# Patient Record
Sex: Female | Born: 1960 | Race: White | Hispanic: No | Smoking: Former smoker
Health system: Southern US, Community
[De-identification: ages and names within clinical notes are randomized; demographics above are authoritative.]

## PROBLEM LIST (undated history)

## (undated) DIAGNOSIS — I2699 Other pulmonary embolism without acute cor pulmonale: Secondary | ICD-10-CM

## (undated) DIAGNOSIS — E7800 Pure hypercholesterolemia, unspecified: Secondary | ICD-10-CM

## (undated) DIAGNOSIS — M797 Fibromyalgia: Secondary | ICD-10-CM

## (undated) DIAGNOSIS — R87629 Unspecified abnormal cytological findings in specimens from vagina: Secondary | ICD-10-CM

## (undated) DIAGNOSIS — I10 Essential (primary) hypertension: Secondary | ICD-10-CM

## (undated) HISTORY — DX: Pure hypercholesterolemia, unspecified: E78.00

## (undated) HISTORY — PX: HIP SURGERY: SHX245

## (undated) HISTORY — DX: Unspecified abnormal cytological findings in specimens from vagina: R87.629

## (undated) HISTORY — PX: ESOPHAGEAL DILATION: SHX303

## (undated) HISTORY — PX: TONSILLECTOMY: SUR1361

## (undated) HISTORY — DX: Other pulmonary embolism without acute cor pulmonale: I26.99

## (undated) HISTORY — DX: Essential (primary) hypertension: I10

---

## 2005-05-04 ENCOUNTER — Encounter: Payer: Self-pay | Admitting: Family Medicine

## 2005-05-04 LAB — CONVERTED CEMR LAB

## 2005-05-05 ENCOUNTER — Other Ambulatory Visit: Admission: RE | Admit: 2005-05-05 | Discharge: 2005-05-05 | Payer: Self-pay | Admitting: Family Medicine

## 2005-05-05 ENCOUNTER — Encounter (INDEPENDENT_AMBULATORY_CARE_PROVIDER_SITE_OTHER): Payer: Self-pay | Admitting: *Deleted

## 2005-05-05 ENCOUNTER — Ambulatory Visit: Payer: Self-pay | Admitting: Family Medicine

## 2005-05-15 ENCOUNTER — Ambulatory Visit: Payer: Self-pay | Admitting: Family Medicine

## 2006-02-09 DIAGNOSIS — I10 Essential (primary) hypertension: Secondary | ICD-10-CM

## 2006-02-09 DIAGNOSIS — F411 Generalized anxiety disorder: Secondary | ICD-10-CM | POA: Insufficient documentation

## 2006-06-02 ENCOUNTER — Encounter: Payer: Self-pay | Admitting: Family Medicine

## 2006-06-02 DIAGNOSIS — E669 Obesity, unspecified: Secondary | ICD-10-CM

## 2006-06-16 ENCOUNTER — Telehealth: Payer: Self-pay | Admitting: Family Medicine

## 2006-07-23 ENCOUNTER — Other Ambulatory Visit: Admission: RE | Admit: 2006-07-23 | Discharge: 2006-07-23 | Payer: Self-pay | Admitting: Family Medicine

## 2006-07-23 ENCOUNTER — Ambulatory Visit: Payer: Self-pay | Admitting: Family Medicine

## 2006-08-02 ENCOUNTER — Telehealth (INDEPENDENT_AMBULATORY_CARE_PROVIDER_SITE_OTHER): Payer: Self-pay | Admitting: *Deleted

## 2007-03-11 ENCOUNTER — Telehealth: Payer: Self-pay | Admitting: Family Medicine

## 2007-07-05 ENCOUNTER — Encounter: Payer: Self-pay | Admitting: Family Medicine

## 2007-08-05 ENCOUNTER — Encounter: Payer: Self-pay | Admitting: Family Medicine

## 2007-08-05 ENCOUNTER — Other Ambulatory Visit: Admission: RE | Admit: 2007-08-05 | Discharge: 2007-08-05 | Payer: Self-pay | Admitting: Family Medicine

## 2007-08-05 ENCOUNTER — Ambulatory Visit: Payer: Self-pay | Admitting: Family Medicine

## 2007-08-10 ENCOUNTER — Encounter: Payer: Self-pay | Admitting: Family Medicine

## 2008-03-02 ENCOUNTER — Telehealth: Payer: Self-pay | Admitting: Family Medicine

## 2008-03-02 ENCOUNTER — Ambulatory Visit: Payer: Self-pay | Admitting: Family Medicine

## 2008-03-02 ENCOUNTER — Ambulatory Visit: Admission: RE | Admit: 2008-03-02 | Discharge: 2008-03-02 | Payer: Self-pay | Admitting: Family Medicine

## 2008-03-02 ENCOUNTER — Encounter: Payer: Self-pay | Admitting: Family Medicine

## 2008-03-02 ENCOUNTER — Ambulatory Visit: Payer: Self-pay | Admitting: Surgery

## 2008-03-02 DIAGNOSIS — M79609 Pain in unspecified limb: Secondary | ICD-10-CM

## 2008-03-05 LAB — CONVERTED CEMR LAB
Albumin: 3.8 g/dL (ref 3.5–5.2)
Alkaline Phosphatase: 74 units/L (ref 39–117)
BUN: 20 mg/dL (ref 6–23)
CO2: 22 meq/L (ref 19–32)
Calcium: 8.7 mg/dL (ref 8.4–10.5)
Chloride: 104 meq/L (ref 96–112)
Eosinophils Absolute: 0.1 10*3/uL (ref 0.0–0.7)
Folate: 9.8 ng/mL
Glucose, Bld: 136 mg/dL — ABNORMAL HIGH (ref 70–99)
Lymphocytes Relative: 38 % (ref 12–46)
Lymphs Abs: 3.8 10*3/uL (ref 0.7–4.0)
MCV: 91.6 fL (ref 78.0–100.0)
Monocytes Relative: 6 % (ref 3–12)
Neutrophils Relative %: 56 % (ref 43–77)
Potassium: 3.5 meq/L (ref 3.5–5.3)
RBC: 4.76 M/uL (ref 3.87–5.11)
TSH: 3.269 microintl units/mL (ref 0.350–4.50)
Total Protein: 7 g/dL (ref 6.0–8.3)
Vitamin B-12: 270 pg/mL (ref 211–911)
WBC: 10 10*3/uL (ref 4.0–10.5)

## 2008-03-08 ENCOUNTER — Encounter: Payer: Self-pay | Admitting: Family Medicine

## 2008-08-07 ENCOUNTER — Telehealth: Payer: Self-pay | Admitting: Family Medicine

## 2017-09-21 DIAGNOSIS — H25811 Combined forms of age-related cataract, right eye: Secondary | ICD-10-CM | POA: Insufficient documentation

## 2019-04-12 ENCOUNTER — Other Ambulatory Visit: Payer: Self-pay

## 2019-04-12 ENCOUNTER — Emergency Department (HOSPITAL_BASED_OUTPATIENT_CLINIC_OR_DEPARTMENT_OTHER): Payer: Managed Care, Other (non HMO)

## 2019-04-12 ENCOUNTER — Encounter (HOSPITAL_BASED_OUTPATIENT_CLINIC_OR_DEPARTMENT_OTHER): Payer: Self-pay

## 2019-04-12 ENCOUNTER — Inpatient Hospital Stay (HOSPITAL_BASED_OUTPATIENT_CLINIC_OR_DEPARTMENT_OTHER)
Admission: EM | Admit: 2019-04-12 | Discharge: 2019-04-20 | DRG: 234 | Disposition: A | Discharge status: Home / Self Care | Payer: Managed Care, Other (non HMO) | Attending: Cardiothoracic Surgery | Admitting: Cardiothoracic Surgery

## 2019-04-12 DIAGNOSIS — R778 Other specified abnormalities of plasma proteins: Secondary | ICD-10-CM

## 2019-04-12 DIAGNOSIS — R079 Chest pain, unspecified: Secondary | ICD-10-CM | POA: Diagnosis present

## 2019-04-12 DIAGNOSIS — I973 Postprocedural hypertension: Secondary | ICD-10-CM | POA: Diagnosis not present

## 2019-04-12 DIAGNOSIS — I2510 Atherosclerotic heart disease of native coronary artery without angina pectoris: Secondary | ICD-10-CM

## 2019-04-12 DIAGNOSIS — E119 Type 2 diabetes mellitus without complications: Secondary | ICD-10-CM | POA: Diagnosis present

## 2019-04-12 DIAGNOSIS — E876 Hypokalemia: Secondary | ICD-10-CM | POA: Diagnosis not present

## 2019-04-12 DIAGNOSIS — D62 Acute posthemorrhagic anemia: Secondary | ICD-10-CM | POA: Diagnosis not present

## 2019-04-12 DIAGNOSIS — Z951 Presence of aortocoronary bypass graft: Secondary | ICD-10-CM

## 2019-04-12 DIAGNOSIS — Z9689 Presence of other specified functional implants: Secondary | ICD-10-CM

## 2019-04-12 DIAGNOSIS — I25110 Atherosclerotic heart disease of native coronary artery with unstable angina pectoris: Secondary | ICD-10-CM | POA: Diagnosis present

## 2019-04-12 DIAGNOSIS — I4891 Unspecified atrial fibrillation: Secondary | ICD-10-CM | POA: Diagnosis present

## 2019-04-12 DIAGNOSIS — M797 Fibromyalgia: Secondary | ICD-10-CM | POA: Diagnosis present

## 2019-04-12 DIAGNOSIS — Z20828 Contact with and (suspected) exposure to other viral communicable diseases: Secondary | ICD-10-CM | POA: Diagnosis not present

## 2019-04-12 DIAGNOSIS — F17200 Nicotine dependence, unspecified, uncomplicated: Secondary | ICD-10-CM | POA: Diagnosis not present

## 2019-04-12 DIAGNOSIS — Z09 Encounter for follow-up examination after completed treatment for conditions other than malignant neoplasm: Secondary | ICD-10-CM

## 2019-04-12 DIAGNOSIS — I200 Unstable angina: Secondary | ICD-10-CM | POA: Diagnosis present

## 2019-04-12 DIAGNOSIS — D696 Thrombocytopenia, unspecified: Secondary | ICD-10-CM | POA: Diagnosis not present

## 2019-04-12 DIAGNOSIS — I10 Essential (primary) hypertension: Secondary | ICD-10-CM | POA: Diagnosis not present

## 2019-04-12 DIAGNOSIS — I161 Hypertensive emergency: Secondary | ICD-10-CM

## 2019-04-12 DIAGNOSIS — I214 Non-ST elevation (NSTEMI) myocardial infarction: Secondary | ICD-10-CM | POA: Diagnosis not present

## 2019-04-12 DIAGNOSIS — Z6841 Body Mass Index (BMI) 40.0 and over, adult: Secondary | ICD-10-CM

## 2019-04-12 DIAGNOSIS — R7989 Other specified abnormal findings of blood chemistry: Secondary | ICD-10-CM

## 2019-04-12 DIAGNOSIS — Z23 Encounter for immunization: Secondary | ICD-10-CM | POA: Diagnosis not present

## 2019-04-12 HISTORY — DX: Fibromyalgia: M79.7

## 2019-04-12 LAB — CBC WITH DIFFERENTIAL/PLATELET
Abs Immature Granulocytes: 0.02 10*3/uL (ref 0.00–0.07)
Basophils Absolute: 0.1 10*3/uL (ref 0.0–0.1)
Basophils Relative: 1 %
Eosinophils Absolute: 0 10*3/uL (ref 0.0–0.5)
Eosinophils Relative: 1 %
HCT: 45 % (ref 36.0–46.0)
Hemoglobin: 14.8 g/dL (ref 12.0–15.0)
Immature Granulocytes: 0 %
Lymphocytes Relative: 26 %
Lymphs Abs: 2 10*3/uL (ref 0.7–4.0)
MCH: 29.2 pg (ref 26.0–34.0)
MCHC: 32.9 g/dL (ref 30.0–36.0)
MCV: 88.8 fL (ref 80.0–100.0)
Monocytes Absolute: 0.5 10*3/uL (ref 0.1–1.0)
Monocytes Relative: 6 %
Neutro Abs: 5.2 10*3/uL (ref 1.7–7.7)
Neutrophils Relative %: 66 %
Platelets: 245 10*3/uL (ref 150–400)
RBC: 5.07 MIL/uL (ref 3.87–5.11)
RDW: 14.4 % (ref 11.5–15.5)
WBC: 7.8 10*3/uL (ref 4.0–10.5)
nRBC: 0 % (ref 0.0–0.2)

## 2019-04-12 LAB — PROTIME-INR
INR: 1 (ref 0.8–1.2)
Prothrombin Time: 13 seconds (ref 11.4–15.2)

## 2019-04-12 LAB — COMPREHENSIVE METABOLIC PANEL
ALT: 29 U/L (ref 0–44)
AST: 26 U/L (ref 15–41)
Albumin: 3.8 g/dL (ref 3.5–5.0)
Alkaline Phosphatase: 93 U/L (ref 38–126)
Anion gap: 10 (ref 5–15)
BUN: 15 mg/dL (ref 6–20)
CO2: 23 mmol/L (ref 22–32)
Calcium: 9.9 mg/dL (ref 8.9–10.3)
Chloride: 101 mmol/L (ref 98–111)
Creatinine, Ser: 1.11 mg/dL — ABNORMAL HIGH (ref 0.44–1.00)
GFR calc Af Amer: >60 mL/min (ref >60)
GFR calc non Af Amer: 55 mL/min — ABNORMAL LOW (ref >60)
Glucose, Bld: 204 mg/dL — ABNORMAL HIGH (ref 70–99)
Potassium: 3.4 mmol/L — ABNORMAL LOW (ref 3.5–5.1)
Sodium: 134 mmol/L — ABNORMAL LOW (ref 135–145)
Total Bilirubin: 0.5 mg/dL (ref 0.3–1.2)
Total Protein: 7.7 g/dL (ref 6.5–8.1)

## 2019-04-12 LAB — HEPARIN LEVEL (UNFRACTIONATED): Heparin Unfractionated: 0.48 IU/mL (ref 0.30–0.70)

## 2019-04-12 LAB — LIPID PANEL
Cholesterol: 240 mg/dL — ABNORMAL HIGH (ref 0–200)
HDL: 37 mg/dL — ABNORMAL LOW (ref >40)
Total CHOL/HDL Ratio: 6.5 RATIO
Triglycerides: 237 mg/dL — ABNORMAL HIGH (ref <150)
VLDL: 47 mg/dL — ABNORMAL HIGH (ref 0–40)

## 2019-04-12 LAB — TROPONIN I (HIGH SENSITIVITY)
Troponin I (High Sensitivity): 260 ng/L (ref <18)
Troponin I (High Sensitivity): 449 ng/L (ref <18)

## 2019-04-12 LAB — APTT: aPTT: 26 seconds (ref 24–36)

## 2019-04-12 LAB — SARS CORONAVIRUS 2 (TAT 6-24 HRS): SARS Coronavirus 2: NEGATIVE

## 2019-04-12 MED ORDER — SODIUM CHLORIDE 0.9 % IV SOLN: INTRAVENOUS | Status: DC

## 2019-04-12 MED ORDER — HEPARIN BOLUS VIA INFUSION
4000.0000 [IU] | Freq: Once | INTRAVENOUS | Status: AC
  Administered 2019-04-12: 4000 [IU] via INTRAVENOUS

## 2019-04-12 MED ORDER — HEPARIN (PORCINE) 25000 UT/250ML-% IV SOLN
INTRAVENOUS | Status: AC
  Administered 2019-04-12: 1000 [IU]/h via INTRAVENOUS
  Filled 2019-04-12: qty 250

## 2019-04-12 MED ORDER — MORPHINE SULFATE (PF) 4 MG/ML IV SOLN
4.0000 mg | INTRAVENOUS | Status: DC | PRN
  Administered 2019-04-12 – 2019-04-14 (×6): 4 mg via INTRAVENOUS
  Filled 2019-04-12 (×6): qty 1

## 2019-04-12 MED ORDER — ONDANSETRON HCL 4 MG/2ML IJ SOLN
4.0000 mg | Freq: Once | INTRAMUSCULAR | Status: AC | PRN
  Administered 2019-04-12: 4 mg via INTRAVENOUS
  Filled 2019-04-12: qty 2

## 2019-04-12 MED ORDER — NITROGLYCERIN 0.4 MG SL SUBL
SUBLINGUAL_TABLET | SUBLINGUAL | Status: AC
  Administered 2019-04-12: 0.4 mg
  Filled 2019-04-12: qty 1

## 2019-04-12 MED ORDER — HEPARIN (PORCINE) 25000 UT/250ML-% IV SOLN
1250.0000 [IU]/h | INTRAVENOUS | Status: DC
  Administered 2019-04-13: 1250 [IU]/h via INTRAVENOUS
  Filled 2019-04-12: qty 250

## 2019-04-12 MED ORDER — NITROGLYCERIN IN D5W 200-5 MCG/ML-% IV SOLN
0.0000 ug/min | INTRAVENOUS | Status: DC
  Administered 2019-04-12: 5 ug/min via INTRAVENOUS
  Administered 2019-04-13: 30 ug/min via INTRAVENOUS
  Administered 2019-04-13: 40 ug/min via INTRAVENOUS
  Filled 2019-04-12: qty 250

## 2019-04-12 NOTE — ED Provider Notes (Signed)
Medical screening examination/treatment/procedure(s) were conducted as a shared visit with non-physician practitioner(s) and myself.  I personally evaluated the patient during the encounter.    58 year old female with chest pain.  Both typical and atypical symptoms.  Arrived extremely hypertensive.  I think this hypertensive emergency at least.  EKG with diffuse ischemic changes.  No prior for comparison purposes.  Initial troponin is elevated.  She was given sublingual nitroglycerin and started on a nitro drip.  Her pain resolved with this.  She had aspirin prior to arrival.  She is started on heparin drip.  Cardiology was consulted. Transfer to [employer] [facility].  EKG:  Rhythm: sinus tachycardia Rate: 111 PR: 124 ms QRS: 100 ms QTc: 452 ms ST segments: ST depression II, III, aVF, V4-6. STE aVR Comparison: none  12:26 PM Repeat troponin higher. Pt remains pain free though. On nitro/heparin gtts. Awaiting transport.     Virgel Manifold, MD 04/13/19 [phone]

## 2019-04-12 NOTE — ED Notes (Addendum)
Provider made aware of elevated troponin.  Pt remains pain free

## 2019-04-12 NOTE — ED Notes (Signed)
Pt reports improvement in pain after nitro SL x1

## 2019-04-12 NOTE — ED Provider Notes (Signed)
[facility] EMERGENCY DEPARTMENT Provider Note   CSN: [id number] Arrival date & time: 04/12/19  0848     History   Chief Complaint Chief Complaint  Patient presents with  . Swallowed Foreign Body  . Chest Pain    HPI Michele [name] is a 58 y.o. female.     Patient with history of fibromyalgia, reported hypertension in the past however she is not currently on any treatment --presents to the emergency department today with complaint of chest pressure described as something stuck in her throat.  She has had similar episodes intermittently over the past several weeks.  Overall, this is a new sensation for her.  Pain started today abruptly at approximately 7 AM when she was walking to her car.  She does report taking Excedrin Migraine this morning and eating some chocolate.  She thought it was related to the pills that she had taken.  She felt a little bit better but when she arrived at work and was walking in, her symptoms increased again.  She has shortness of breath when the symptoms are worse.  She has not had any lightheadedness or syncope.  No diaphoresis.  She feels extremely nauseous and "wants to vomit" during these attacks.  No abdominal pains.  No strokelike symptoms.  Patient took a total of 500 mg of aspirin this morning as part of the Excedrin Migraine.  She does not routinely see a doctor.  She does not have a known family history of ACS, however does not know a lot of details about her family's history.  She is a smoker.     Past Medical History:  Diagnosis Date  . Fibromyalgia     Patient Active Problem List   Diagnosis Date Noted  . LEG PAIN, RIGHT 03/02/2008  . OBESITY NOS 06/02/2006  . ANXIETY 02/09/2006  . HYPERTENSION, BENIGN SYSTEMIC 02/09/2006    History reviewed. No pertinent surgical history.   OB History   No obstetric history on file.      Home Medications    Prior to Admission medications   Medication Sig Start Date End Date  Taking? Authorizing Provider  aspirin-acetaminophen-caffeine (EXCEDRIN MIGRAINE) [phone] MG tablet Take 1 tablet by mouth every 6 (six) hours as needed for headache.   Yes [provider]    Family History History reviewed. No pertinent family history.  Social History Social History   Tobacco Use  . Smoking status: Current Every Day Smoker  . Smokeless tobacco: Never Used  Substance Use Topics  . Alcohol use: Not on file  . Drug use: Not on file     Allergies   Patient has no known allergies.   Review of Systems Review of Systems  Constitutional: Negative for chills and fever.  HENT: Negative for rhinorrhea and sore throat.   Eyes: Negative for redness.  Respiratory: Positive for shortness of breath. Negative for cough.   Cardiovascular: Positive for chest pain.  Gastrointestinal: Positive for nausea. Negative for abdominal pain, diarrhea and vomiting.  Genitourinary: Negative for dysuria.  Musculoskeletal: Positive for myalgias (chronic 2/2 fibromyalgia).  Skin: Negative for rash.  Neurological: Negative for headaches.     Physical Exam Updated Vital Signs BP (!) 230/110 (BP Location: Left Arm)   Pulse (!) 110   Temp 98.5 F (36.9 C) (Oral)   Resp 18   Ht 5\' 4"  (1.626 m)   Wt 117.9 kg   SpO2 99%   BMI 44.63 kg/m   Physical Exam Vitals signs and  nursing note reviewed.  Constitutional:      Appearance: She is well-developed.  HENT:     Head: Normocephalic and atraumatic.  Eyes:     General:        Right eye: No discharge.        Left eye: No discharge.     Conjunctiva/sclera: Conjunctivae normal.  Neck:     Musculoskeletal: Normal range of motion and neck supple.  Cardiovascular:     Rate and Rhythm: Regular rhythm. Tachycardia present.     Pulses:          Radial pulses are 2+ on the right side and 2+ on the left side.     Heart sounds: Normal heart sounds.  Pulmonary:     Effort: Pulmonary effort is normal.     Breath sounds: Normal  breath sounds.  Chest:     Chest wall: Tenderness present.     Comments: Generalized tenderness of chest wall but patient states this isn't unusual due to fibromyalgia Abdominal:     Palpations: Abdomen is soft.     Tenderness: There is no abdominal tenderness.  Musculoskeletal:     Right lower leg: She exhibits no tenderness. No edema.     Left lower leg: She exhibits no tenderness. No edema.  Skin:    General: Skin is warm and dry.  Neurological:     Mental Status: She is alert.      ED Treatments / Results  Labs (all labs ordered are listed, but only abnormal results are displayed) Labs Reviewed  SARS CORONAVIRUS 2 (TAT 6-24 HRS)  CBC WITH DIFFERENTIAL/PLATELET  PROTIME-INR  APTT  COMPREHENSIVE METABOLIC PANEL  LIPID PANEL  PREGNANCY, URINE  TROPONIN I (HIGH SENSITIVITY)    ED ECG REPORT   Date: 04/12/2019  Rate: 111  Rhythm: sinus tachycardia  QRS Axis: normal  Intervals: normal  ST/T Wave abnormalities: ST depressions diffusely  Conduction Disutrbances:none  Narrative Interpretation:   Old EKG Reviewed: none available  I have personally reviewed the EKG tracing and agree with the computerized printout as noted.  Radiology No results found.  Procedures Procedures (including critical care time)  Medications Ordered in ED Medications  0.9 %  sodium chloride infusion (has no administration in time range)  nitroGLYCERIN 50 mg in dextrose 5 % 250 mL (0.2 mg/mL) infusion (has no administration in time range)  morphine 4 MG/ML injection 4 mg (4 mg Intravenous Given 04/12/19 0933)  nitroGLYCERIN (NITROSTAT) 0.4 MG SL tablet (0.4 mg  Given 04/12/19 0918)  ondansetron (ZOFRAN) injection 4 mg (4 mg Intravenous Given 04/12/19 0933)     Initial Impression / Assessment and Plan / ED Course  I have reviewed the triage vital signs and the nursing notes.  Pertinent labs & imaging results that were available during my care of the patient were reviewed by me and  considered in my medical decision making (see chart for details).        Patient seen and examined just after arrival to the room.  EKG reviewed with Dr. Juleen China.  Concerning for global ischemia with widespread ST segment depression.  No old for comparison.  No STEMI at this time.  Patient is severely hypertensive and will be given sublingual nitro while nitroglycerin drip is set up.  Patient is at minimum, hypertensive emergency.  Will continue to check EKGs to ensure no developing STEMI.  Vital signs reviewed and are as follows: BP (!) 230/110 (BP Location: Left Arm)   Pulse (!) 110  Temp 98.5 F (36.9 C) (Oral)   Resp 18   Ht 5\' 4"  (1.626 m)   Wt 117.9 kg   SpO2 99%   BMI 44.63 kg/m   9:38 AM No failure noted on x-ray. BP a bit lower now. States pain is almost gone, just continued foreign body sensation in throat. Awaiting labs. Will repeat EKG.   10:23 AM patient updated.  She is stable.  Chest pain is improved.  She continues to have a foreign body sensation in the back of her throat.  Nitro and heparin drip being started.  I spoke with Dr. Clifton JamesMcAlhany of cardiology who agrees to admit the patient to his service.  Telemetry bed requested.  Agrees with heparin, aspirin, nitro drip.  CRITICAL CARE Performed by: Renne CriglerJoshua Ruthe Roemer PA-C Total critical care time: 45 minutes Critical care time was exclusive of separately billable procedures and treating other patients. Critical care was necessary to treat or prevent imminent or life-threatening deterioration. Critical care was time spent personally by me on the following activities: development of treatment plan with patient and/or surrogate as well as nursing, discussions with consultants, evaluation of patient's response to treatment, examination of patient, obtaining history from patient or surrogate, ordering and performing treatments and interventions, ordering and review of laboratory studies, ordering and review of radiographic studies,  pulse oximetry and re-evaluation of patient's condition.  5:25 PM Pt stable during ED stay. Bed has been assigned. Currently awaiting transportation.   Final Clinical Impressions(s) / ED Diagnoses   Final diagnoses:  Hypertensive emergency  Elevated troponin   Admit.    ED Discharge Orders    None       Renne CriglerGeiple, Aahana Elza, Cordelia Poche-C 04/12/19 1725    Raeford RazorKohut, Stephen, MD 04/13/19 [id number]

## 2019-04-12 NOTE — Progress Notes (Signed)
[place] for heparin Indication: chest pain/ACS  Heparin Dosing Weight: 83.2 kg  Labs: Recent Labs    04/12/19 0917 04/12/19 1145 04/12/19 1724  HGB 14.8  --   --   HCT 45.0  --   --   PLT 245  --   --   APTT 26  --   --   LABPROT 13.0  --   --   INR 1.0  --   --   HEPARINUNFRC  --   --  0.48  CREATININE 1.11*  --   --   TROPONINIHS 260* 449*  --     Estimated Creatinine Clearance: 69.8 mL/min (A) (by C-G formula based on SCr of 1.11 mg/dL (H)).  Assessment: 58 yr old female presented to [place] ED with CP, elevated high-sensitivity troponin. Pharmacy consulted to dose heparin. Patient is not on anticoagulation PTA. CBC WNL Pt awaiting transfer to [facility].  Heparin level ~6.8 hrs after heparin 4000 units IV bolus X 1, followed by initiation of heparin infusion at 1000 units/hr was 0.48 units/ml, which is within the goal range for this patient. Per RN, no issues with IV or bleeding observed.  Goal of Therapy:  Heparin level 0.3-0.7 units/ml Monitor platelets by anticoagulation protocol: Yes   Plan:  Continue heparin infusion at 1000 units/h Check confirmatory 6-hr heparin level Monitor daily heparin level, CBC Monitor for signs/symptoms of bleeding  Gillermina Hu, PharmD, BCPS, [facility] Clinical Pharmacist 04/12/19, 19:59 PM

## 2019-04-12 NOTE — ED Triage Notes (Signed)
Pt states pain central chest/esophagus feels like something stuck. States has had this before, sensation usually goes away after drinking soda.  Today, sensation came back.

## 2019-04-12 NOTE — Progress Notes (Signed)
[place] for heparin Indication: chest pain/ACS  Heparin Dosing Weight: 83.2 kg  Labs: Recent Labs    04/12/19 0917  HGB 14.8  HCT 45.0  PLT 245  APTT 26  LABPROT 13.0  INR 1.0  CREATININE 1.11*  TROPONINIHS 260*    Estimated Creatinine Clearance: 69.8 mL/min (A) (by C-G formula based on SCr of 1.11 mg/dL (H)).  Assessment: 67 yof presenting with CP, elevated high-sensitivity troponin. Pharmacy consulted to dose heparin. Patient is not on anticoagulation PTA. CBC wnl. No active bleed issues documented.  Goal of Therapy:  Heparin level 0.3-0.7 units/ml Monitor platelets by anticoagulation protocol: Yes   Plan:  Heparin 4000 unit bolus Start heparin at 1000 units/h 6h heparin level Daily heparin level/CBC Monitor s/sx bleeding   Elicia Lamp, PharmD, BCPS Clinical Pharmacist 03/26/2019 8:49 PM

## 2019-04-13 ENCOUNTER — Encounter (HOSPITAL_COMMUNITY)
Admission: EM | Disposition: A | Discharge status: Home / Self Care | Payer: Self-pay | Source: Home / Self Care | Attending: Cardiothoracic Surgery

## 2019-04-13 ENCOUNTER — Other Ambulatory Visit: Payer: Self-pay | Admitting: *Deleted

## 2019-04-13 ENCOUNTER — Observation Stay (HOSPITAL_BASED_OUTPATIENT_CLINIC_OR_DEPARTMENT_OTHER): Payer: Managed Care, Other (non HMO)

## 2019-04-13 ENCOUNTER — Observation Stay (HOSPITAL_COMMUNITY): Payer: Managed Care, Other (non HMO)

## 2019-04-13 DIAGNOSIS — Z01810 Encounter for preprocedural cardiovascular examination: Secondary | ICD-10-CM | POA: Diagnosis not present

## 2019-04-13 DIAGNOSIS — I160 Hypertensive urgency: Secondary | ICD-10-CM | POA: Diagnosis not present

## 2019-04-13 DIAGNOSIS — I2510 Atherosclerotic heart disease of native coronary artery without angina pectoris: Secondary | ICD-10-CM

## 2019-04-13 DIAGNOSIS — R778 Other specified abnormalities of plasma proteins: Secondary | ICD-10-CM | POA: Diagnosis not present

## 2019-04-13 DIAGNOSIS — I739 Peripheral vascular disease, unspecified: Secondary | ICD-10-CM

## 2019-04-13 DIAGNOSIS — R079 Chest pain, unspecified: Secondary | ICD-10-CM | POA: Diagnosis present

## 2019-04-13 DIAGNOSIS — I25118 Atherosclerotic heart disease of native coronary artery with other forms of angina pectoris: Secondary | ICD-10-CM

## 2019-04-13 DIAGNOSIS — I25110 Atherosclerotic heart disease of native coronary artery with unstable angina pectoris: Secondary | ICD-10-CM

## 2019-04-13 DIAGNOSIS — I214 Non-ST elevation (NSTEMI) myocardial infarction: Secondary | ICD-10-CM

## 2019-04-13 HISTORY — PX: LEFT HEART CATH AND CORONARY ANGIOGRAPHY: CATH118249

## 2019-04-13 LAB — COMPREHENSIVE METABOLIC PANEL
ALT: 26 U/L (ref 0–44)
AST: 19 U/L (ref 15–41)
Albumin: 2.9 g/dL — ABNORMAL LOW (ref 3.5–5.0)
Alkaline Phosphatase: 75 U/L (ref 38–126)
Anion gap: 10 (ref 5–15)
BUN: 13 mg/dL (ref 6–20)
CO2: 25 mmol/L (ref 22–32)
Calcium: 8.4 mg/dL — ABNORMAL LOW (ref 8.9–10.3)
Chloride: 102 mmol/L (ref 98–111)
Creatinine, Ser: 0.99 mg/dL (ref 0.44–1.00)
GFR calc Af Amer: >60 mL/min (ref >60)
GFR calc non Af Amer: >60 mL/min (ref >60)
Glucose, Bld: 177 mg/dL — ABNORMAL HIGH (ref 70–99)
Potassium: 3.6 mmol/L (ref 3.5–5.1)
Sodium: 137 mmol/L (ref 135–145)
Total Bilirubin: 0.7 mg/dL (ref 0.3–1.2)
Total Protein: 6.6 g/dL (ref 6.5–8.1)

## 2019-04-13 LAB — LIPID PANEL
Cholesterol: 209 mg/dL — ABNORMAL HIGH (ref 0–200)
HDL: 30 mg/dL — ABNORMAL LOW (ref >40)
LDL Cholesterol: 124 mg/dL — ABNORMAL HIGH (ref 0–99)
Total CHOL/HDL Ratio: 7 RATIO
Triglycerides: 274 mg/dL — ABNORMAL HIGH (ref <150)
VLDL: 55 mg/dL — ABNORMAL HIGH (ref 0–40)

## 2019-04-13 LAB — PULMONARY FUNCTION TEST
FEF 25-75 Pre: 1.7 L/sec
FEF2575-%Pred-Pre: 69 %
FEV1-%Pred-Pre: 60 %
FEV1-Pre: 1.59 L
FEV1FVC-%Pred-Pre: 102 %
FEV6-%Pred-Pre: 60 %
FEV6-Pre: 1.97 L
FEV6FVC-%Pred-Pre: 103 %
FVC-%Pred-Pre: 58 %
FVC-Pre: 1.97 L
Pre FEV1/FVC ratio: 80 %
Pre FEV6/FVC Ratio: 100 %

## 2019-04-13 LAB — CBC
HCT: 40.5 % (ref 36.0–46.0)
Hemoglobin: 13.2 g/dL (ref 12.0–15.0)
MCH: 29 pg (ref 26.0–34.0)
MCHC: 32.6 g/dL (ref 30.0–36.0)
MCV: 89 fL (ref 80.0–100.0)
Platelets: 235 10*3/uL (ref 150–400)
RBC: 4.55 MIL/uL (ref 3.87–5.11)
RDW: 14.6 % (ref 11.5–15.5)
WBC: 8.1 10*3/uL (ref 4.0–10.5)
nRBC: 0 % (ref 0.0–0.2)

## 2019-04-13 LAB — BASIC METABOLIC PANEL
Anion gap: 9 (ref 5–15)
BUN: 13 mg/dL (ref 6–20)
CO2: 28 mmol/L (ref 22–32)
Calcium: 8.7 mg/dL — ABNORMAL LOW (ref 8.9–10.3)
Chloride: 102 mmol/L (ref 98–111)
Creatinine, Ser: 1.09 mg/dL — ABNORMAL HIGH (ref 0.44–1.00)
GFR calc Af Amer: >60 mL/min (ref >60)
GFR calc non Af Amer: 56 mL/min — ABNORMAL LOW (ref >60)
Glucose, Bld: 160 mg/dL — ABNORMAL HIGH (ref 70–99)
Potassium: 3.6 mmol/L (ref 3.5–5.1)
Sodium: 139 mmol/L (ref 135–145)

## 2019-04-13 LAB — GLUCOSE, CAPILLARY
Glucose-Capillary: 130 mg/dL — ABNORMAL HIGH (ref 70–99)
Glucose-Capillary: 203 mg/dL — ABNORMAL HIGH (ref 70–99)

## 2019-04-13 LAB — SURGICAL PCR SCREEN
MRSA, PCR: NEGATIVE
Staphylococcus aureus: NEGATIVE

## 2019-04-13 LAB — ECHOCARDIOGRAM COMPLETE
Height: 64 in
Weight: 4504.44 oz

## 2019-04-13 LAB — HEMOGLOBIN A1C
Hgb A1c MFr Bld: 7.8 % — ABNORMAL HIGH (ref 4.8–5.6)
Mean Plasma Glucose: 177.16 mg/dL

## 2019-04-13 LAB — PROTIME-INR
INR: 1.1 (ref 0.8–1.2)
Prothrombin Time: 13.9 seconds (ref 11.4–15.2)

## 2019-04-13 LAB — HEPARIN LEVEL (UNFRACTIONATED): Heparin Unfractionated: 0.13 IU/mL — ABNORMAL LOW (ref 0.30–0.70)

## 2019-04-13 LAB — APTT: aPTT: 27 seconds (ref 24–36)

## 2019-04-13 LAB — ABO/RH: ABO/RH(D): A POS

## 2019-04-13 SURGERY — LEFT HEART CATH AND CORONARY ANGIOGRAPHY
Anesthesia: LOCAL

## 2019-04-13 MED ORDER — SODIUM CHLORIDE 0.9 % IV SOLN
INTRAVENOUS | Status: DC
  Filled 2019-04-13: qty 30

## 2019-04-13 MED ORDER — ONDANSETRON HCL 4 MG/2ML IJ SOLN
4.0000 mg | Freq: Four times a day (QID) | INTRAMUSCULAR | Status: DC | PRN
  Administered 2019-04-13: 4 mg via INTRAVENOUS
  Filled 2019-04-13: qty 2

## 2019-04-13 MED ORDER — MAGNESIUM SULFATE 50 % IJ SOLN
40.0000 meq | INTRAMUSCULAR | Status: DC
  Filled 2019-04-13: qty 9.85

## 2019-04-13 MED ORDER — SODIUM CHLORIDE 0.9 % IV SOLN: 250.0000 mL | INTRAVENOUS | Status: DC | PRN

## 2019-04-13 MED ORDER — METOPROLOL TARTRATE 12.5 MG HALF TABLET
12.5000 mg | ORAL_TABLET | Freq: Once | ORAL | Status: AC
  Administered 2019-04-14: 12.5 mg via ORAL
  Filled 2019-04-13: qty 1

## 2019-04-13 MED ORDER — HYDRALAZINE HCL 20 MG/ML IJ SOLN: 10.0000 mg | INTRAMUSCULAR | Status: AC | PRN

## 2019-04-13 MED ORDER — PHENYLEPHRINE HCL-NACL 20-0.9 MG/250ML-% IV SOLN
30.0000 ug/min | INTRAVENOUS | Status: AC
  Administered 2019-04-14: 25 ug/min via INTRAVENOUS
  Filled 2019-04-13: qty 250

## 2019-04-13 MED ORDER — HEPARIN (PORCINE) IN NACL 1000-0.9 UT/500ML-% IV SOLN
INTRAVENOUS | Status: AC
  Filled 2019-04-13: qty 1000

## 2019-04-13 MED ORDER — CHLORHEXIDINE GLUCONATE 4 % EX LIQD
CUTANEOUS | Status: AC
  Administered 2019-04-13: 22:00:00
  Filled 2019-04-13: qty 120

## 2019-04-13 MED ORDER — INSULIN ASPART 100 UNIT/ML ~~LOC~~ SOLN: 0.0000 [IU] | Freq: Every day | SUBCUTANEOUS | Status: DC

## 2019-04-13 MED ORDER — DEXMEDETOMIDINE HCL IN NACL 400 MCG/100ML IV SOLN
0.1000 ug/kg/h | INTRAVENOUS | Status: AC
  Administered 2019-04-14: .3 ug/kg/h via INTRAVENOUS
  Filled 2019-04-13: qty 100

## 2019-04-13 MED ORDER — CHLORHEXIDINE GLUCONATE 4 % EX LIQD
Freq: Once | CUTANEOUS | Status: AC
  Administered 2019-04-13: 22:00:00 via TOPICAL

## 2019-04-13 MED ORDER — SODIUM CHLORIDE 0.9% FLUSH: 3.0000 mL | Freq: Two times a day (BID) | INTRAVENOUS | Status: DC

## 2019-04-13 MED ORDER — NITROGLYCERIN IN D5W 200-5 MCG/ML-% IV SOLN
2.0000 ug/min | INTRAVENOUS | Status: DC
  Filled 2019-04-13: qty 250

## 2019-04-13 MED ORDER — ATORVASTATIN CALCIUM 80 MG PO TABS
80.0000 mg | ORAL_TABLET | Freq: Every day | ORAL | Status: DC
  Administered 2019-04-13 – 2019-04-19 (×6): 80 mg via ORAL
  Filled 2019-04-13 (×6): qty 1

## 2019-04-13 MED ORDER — MIDAZOLAM HCL 2 MG/2ML IJ SOLN
INTRAMUSCULAR | Status: DC | PRN
  Administered 2019-04-13: 2 mg via INTRAVENOUS

## 2019-04-13 MED ORDER — MIDAZOLAM HCL 2 MG/2ML IJ SOLN
INTRAMUSCULAR | Status: AC
  Filled 2019-04-13: qty 2

## 2019-04-13 MED ORDER — MUPIROCIN 2 % EX OINT
1.0000 "application " | TOPICAL_OINTMENT | Freq: Two times a day (BID) | CUTANEOUS | Status: AC
  Administered 2019-04-13 – 2019-04-17 (×8): 1 via NASAL
  Filled 2019-04-13 (×2): qty 22

## 2019-04-13 MED ORDER — LABETALOL HCL 5 MG/ML IV SOLN: 10.0000 mg | INTRAVENOUS | Status: AC | PRN

## 2019-04-13 MED ORDER — BISACODYL 5 MG PO TBEC
5.0000 mg | DELAYED_RELEASE_TABLET | Freq: Once | ORAL | Status: AC
  Administered 2019-04-13: 5 mg via ORAL
  Filled 2019-04-13: qty 1

## 2019-04-13 MED ORDER — SODIUM CHLORIDE 0.9 % IV SOLN
1.5000 g | INTRAVENOUS | Status: AC
  Administered 2019-04-14: 1.5 g via INTRAVENOUS
  Administered 2019-04-14: .75 g via INTRAVENOUS
  Filled 2019-04-13: qty 1.5

## 2019-04-13 MED ORDER — HEPARIN (PORCINE) IN NACL 1000-0.9 UT/500ML-% IV SOLN
INTRAVENOUS | Status: DC | PRN
  Administered 2019-04-13: 500 mL

## 2019-04-13 MED ORDER — PERFLUTREN LIPID MICROSPHERE
1.0000 mL | INTRAVENOUS | Status: AC | PRN
  Administered 2019-04-13: 3 mL via INTRAVENOUS
  Filled 2019-04-13: qty 10

## 2019-04-13 MED ORDER — FENTANYL CITRATE (PF) 100 MCG/2ML IJ SOLN
INTRAMUSCULAR | Status: AC
  Filled 2019-04-13: qty 2

## 2019-04-13 MED ORDER — LIDOCAINE HCL (PF) 1 % IJ SOLN
INTRAMUSCULAR | Status: DC | PRN
  Administered 2019-04-13: 3 mL

## 2019-04-13 MED ORDER — NITROGLYCERIN IN D5W 200-5 MCG/ML-% IV SOLN
0.0000 ug/min | INTRAVENOUS | Status: DC
  Filled 2019-04-13: qty 250

## 2019-04-13 MED ORDER — SODIUM CHLORIDE 0.9 % IV SOLN
INTRAVENOUS | Status: DC
  Administered 2019-04-13: 12:00:00 via INTRAVENOUS

## 2019-04-13 MED ORDER — MILRINONE LACTATE IN DEXTROSE 20-5 MG/100ML-% IV SOLN
0.3000 ug/kg/min | INTRAVENOUS | Status: DC
  Filled 2019-04-13: qty 100

## 2019-04-13 MED ORDER — NOREPINEPHRINE 4 MG/250ML-% IV SOLN
0.0000 ug/min | INTRAVENOUS | Status: DC
  Filled 2019-04-13: qty 250

## 2019-04-13 MED ORDER — IOHEXOL 350 MG/ML SOLN
INTRAVENOUS | Status: DC | PRN
  Administered 2019-04-13: 90 mL

## 2019-04-13 MED ORDER — INSULIN ASPART 100 UNIT/ML ~~LOC~~ SOLN
0.0000 [IU] | Freq: Three times a day (TID) | SUBCUTANEOUS | Status: DC
  Administered 2019-04-13: 2 [IU] via SUBCUTANEOUS

## 2019-04-13 MED ORDER — INSULIN REGULAR(HUMAN) IN NACL 100-0.9 UT/100ML-% IV SOLN
INTRAVENOUS | Status: AC
  Administered 2019-04-14: 3.4 [IU]/h via INTRAVENOUS
  Filled 2019-04-13: qty 100

## 2019-04-13 MED ORDER — ASPIRIN 81 MG PO CHEW: 81.0000 mg | CHEWABLE_TABLET | Freq: Every day | ORAL | Status: DC

## 2019-04-13 MED ORDER — SODIUM CHLORIDE 0.9% FLUSH: 3.0000 mL | INTRAVENOUS | Status: DC | PRN

## 2019-04-13 MED ORDER — EPINEPHRINE HCL 5 MG/250ML IV SOLN IN NS
0.0000 ug/min | INTRAVENOUS | Status: DC
  Filled 2019-04-13: qty 250

## 2019-04-13 MED ORDER — TEMAZEPAM 15 MG PO CAPS: 15.0000 mg | ORAL_CAPSULE | Freq: Once | ORAL | Status: DC | PRN

## 2019-04-13 MED ORDER — ACETAMINOPHEN 325 MG PO TABS: 650.0000 mg | ORAL_TABLET | ORAL | Status: DC | PRN

## 2019-04-13 MED ORDER — CHLORHEXIDINE GLUCONATE CLOTH 2 % EX PADS
6.0000 | MEDICATED_PAD | Freq: Once | CUTANEOUS | Status: AC
  Administered 2019-04-13: 6 via TOPICAL

## 2019-04-13 MED ORDER — HEPARIN (PORCINE) 25000 UT/250ML-% IV SOLN
1400.0000 [IU]/h | INTRAVENOUS | Status: DC
  Administered 2019-04-13: 1250 [IU]/h via INTRAVENOUS
  Filled 2019-04-13: qty 250

## 2019-04-13 MED ORDER — CHLORHEXIDINE GLUCONATE 0.12 % MT SOLN
15.0000 mL | Freq: Once | OROMUCOSAL | Status: AC
  Administered 2019-04-14: 15 mL via OROMUCOSAL
  Filled 2019-04-13: qty 15

## 2019-04-13 MED ORDER — VERAPAMIL HCL 2.5 MG/ML IV SOLN
INTRAVENOUS | Status: DC | PRN
  Administered 2019-04-13: 10 mL via INTRA_ARTERIAL

## 2019-04-13 MED ORDER — SODIUM CHLORIDE 0.9 % IV SOLN
750.0000 mg | INTRAVENOUS | Status: DC
  Filled 2019-04-13: qty 750

## 2019-04-13 MED ORDER — SODIUM CHLORIDE 0.9 % WEIGHT BASED INFUSION
3.0000 mL/kg/h | INTRAVENOUS | Status: DC
  Administered 2019-04-13: 3 mL/kg/h via INTRAVENOUS

## 2019-04-13 MED ORDER — DIAZEPAM 5 MG PO TABS: 5.0000 mg | ORAL_TABLET | Freq: Four times a day (QID) | ORAL | Status: DC | PRN

## 2019-04-13 MED ORDER — ACETAMINOPHEN 325 MG PO TABS
650.0000 mg | ORAL_TABLET | Freq: Four times a day (QID) | ORAL | Status: DC | PRN
  Administered 2019-04-13: 650 mg via ORAL
  Filled 2019-04-13: qty 2

## 2019-04-13 MED ORDER — HEPARIN SODIUM (PORCINE) 1000 UNIT/ML IJ SOLN
INTRAMUSCULAR | Status: AC
  Filled 2019-04-13: qty 1

## 2019-04-13 MED ORDER — CARVEDILOL 3.125 MG PO TABS
3.1250 mg | ORAL_TABLET | Freq: Two times a day (BID) | ORAL | Status: DC
  Administered 2019-04-13 (×2): 3.125 mg via ORAL
  Filled 2019-04-13 (×2): qty 1

## 2019-04-13 MED ORDER — VERAPAMIL HCL 2.5 MG/ML IV SOLN
INTRAVENOUS | Status: AC
  Filled 2019-04-13: qty 2

## 2019-04-13 MED ORDER — TRANEXAMIC ACID (OHS) BOLUS VIA INFUSION
15.0000 mg/kg | INTRAVENOUS | Status: AC
  Administered 2019-04-14: 1915.5 mg via INTRAVENOUS
  Filled 2019-04-13: qty 1916

## 2019-04-13 MED ORDER — HEPARIN SODIUM (PORCINE) 1000 UNIT/ML IJ SOLN
INTRAMUSCULAR | Status: DC | PRN
  Administered 2019-04-13: 6000 [IU] via INTRAVENOUS

## 2019-04-13 MED ORDER — PLASMA-LYTE 148 IV SOLN
INTRAVENOUS | Status: DC
  Filled 2019-04-13: qty 2.5

## 2019-04-13 MED ORDER — LIDOCAINE HCL (PF) 1 % IJ SOLN
INTRAMUSCULAR | Status: AC
  Filled 2019-04-13: qty 30

## 2019-04-13 MED ORDER — ASPIRIN 81 MG PO CHEW
81.0000 mg | CHEWABLE_TABLET | ORAL | Status: AC
  Administered 2019-04-13: 81 mg via ORAL
  Filled 2019-04-13: qty 1

## 2019-04-13 MED ORDER — TRANEXAMIC ACID (OHS) PUMP PRIME SOLUTION
2.0000 mg/kg | INTRAVENOUS | Status: DC
  Filled 2019-04-13: qty 2.55

## 2019-04-13 MED ORDER — FENTANYL CITRATE (PF) 100 MCG/2ML IJ SOLN
INTRAMUSCULAR | Status: DC | PRN
  Administered 2019-04-13: 25 ug via INTRAVENOUS

## 2019-04-13 MED ORDER — VANCOMYCIN HCL 10 G IV SOLR
1500.0000 mg | INTRAVENOUS | Status: AC
  Administered 2019-04-14: 1500 mg via INTRAVENOUS
  Filled 2019-04-13: qty 1500

## 2019-04-13 MED ORDER — TRANEXAMIC ACID 1000 MG/10ML IV SOLN
1.5000 mg/kg/h | INTRAVENOUS | Status: AC
  Administered 2019-04-14: 1.5 mg/kg/h via INTRAVENOUS
  Filled 2019-04-13: qty 25

## 2019-04-13 MED ORDER — SODIUM CHLORIDE 0.9 % WEIGHT BASED INFUSION
1.0000 mL/kg/h | INTRAVENOUS | Status: DC
  Administered 2019-04-13: 1 mL/kg/h via INTRAVENOUS

## 2019-04-13 MED ORDER — POTASSIUM CHLORIDE 2 MEQ/ML IV SOLN
80.0000 meq | INTRAVENOUS | Status: DC
  Filled 2019-04-13: qty 40

## 2019-04-13 SURGICAL SUPPLY — 13 items
BRACE RADIAL COMPRESSION RADST (HEMOSTASIS) ×1 IMPLANT
CATH INFINITI 5FR ANG PIGTAIL (CATHETERS) ×1 IMPLANT
CATH OPTITORQUE TIG 4.0 5F (CATHETERS) ×2 IMPLANT
DEVICE RAD COMP TR BAND LRG (VASCULAR PRODUCTS) ×2 IMPLANT
GLIDESHEATH SLEND SS 6F .021 (SHEATH) ×1 IMPLANT
GUIDEWIRE INQWIRE 1.5J.035X260 (WIRE) IMPLANT
HOVERMATT SINGLE USE (MISCELLANEOUS) ×1 IMPLANT
INQWIRE 1.5J .035X260CM (WIRE) ×2
KIT HEART LEFT (KITS) ×2 IMPLANT
PACK CARDIAC CATHETERIZATION (CUSTOM PROCEDURE TRAY) ×2 IMPLANT
SYR MEDRAD MARK 7 150ML (SYRINGE) ×2 IMPLANT
TRANSDUCER W/STOPCOCK (MISCELLANEOUS) ×2 IMPLANT
TUBING CIL FLEX 10 FLL-RA (TUBING) ×2 IMPLANT

## 2019-04-13 NOTE — Interval H&P Note (Signed)
Cath Lab Visit (complete for each Cath Lab visit)  Clinical Evaluation Leading to the Procedure:   ACS: No.  Non-ACS:    Anginal Classification: CCS II  Anti-ischemic medical therapy: Minimal Therapy (1 class of medications)  Non-Invasive Test Results: No non-invasive testing performed  Prior CABG: No previous CABG      History and Physical Interval Note:  04/13/2019 9:42 AM  Michele [name]  has presented today for surgery, with the diagnosis of nonstemi.  The various methods of treatment have been discussed with the patient and family. After consideration of risks, benefits and other options for treatment, the patient has consented to  Procedure(s): LEFT HEART CATH AND CORONARY ANGIOGRAPHY (N/A) as a surgical intervention.  The patient's history has been reviewed, patient examined, no change in status, stable for surgery.  I have reviewed the patient's chart and labs.  Questions were answered to the patient's satisfaction.     Shelva Majestic

## 2019-04-13 NOTE — Progress Notes (Signed)
[place] for heparin Indication: chest pain/ACS  Heparin Dosing Weight: 83.2 kg  Labs: Recent Labs    04/12/19 0917 04/12/19 1145 04/12/19 1724 04/13/19 0434 04/13/19 0733  HGB 14.8  --   --  13.2  --   HCT 45.0  --   --  40.5  --   PLT 245  --   --  235  --   APTT 26  --   --   --   --   LABPROT 13.0  --   --   --   --   INR 1.0  --   --   --   --   HEPARINUNFRC  --   --  0.48 0.13*  --   CREATININE 1.11*  --   --   --  1.09*  TROPONINIHS 260* 449*  --   --   --     Estimated Creatinine Clearance: 74.5 mL/min (A) (by C-G formula based on SCr of 1.09 mg/dL (H)).  Assessment: 58 yr old female presented to [place] ED, now [facility] with CP, elevated high-sensitivity troponin. Pharmacy consulted to dose heparin. Patient is not on anticoagulation PTA.   Underwent cardiac cath 12/10 finding multivessel CAD. Plan for [facility]VS consult for CABG revascularization eval - restart heparin 10 hours after sheath removal (documented on 12/10@1026 ). Hgb 13.2, plt 235. No s/sx of bleeding.  Goal of Therapy:  Heparin level 0.3-0.7 units/ml Monitor platelets by anticoagulation protocol: Yes   Plan:  Will restart heparin infusion at 1250 units/hr on 12/10@2030  F/u 6 hour heparin level with AM labs Monitor daily HL, CBC, and for s/sx of bleeding  Antonietta Jewel, PharmD, BCCCP Clinical Pharmacist  Phone: [phone]  Please check AMION for all [place] phone numbers After 10:00 PM, call [place] [phone] 04/13/2019 11:24 AM

## 2019-04-13 NOTE — Anesthesia Preprocedure Evaluation (Addendum)
Anesthesia Evaluation  Patient identified by MRN, date of birth, ID band Patient awake    Reviewed: Allergy & Precautions, NPO status , Patient's Chart, lab work & pertinent test results  History of Anesthesia Complications Negative for: history of anesthetic complications  Airway Mallampati: II  TM Distance: >3 FB Neck ROM: Full    Dental  (+) Dental Advisory Given, Chipped,    Pulmonary Current Smoker and Patient abstained from smoking.,    Pulmonary exam normal        Cardiovascular hypertension, + CAD  Normal cardiovascular exam   '20 Carotid US - 40-59% right ICAS, 1-39% left ICAS  '20 TTE - EF 70 to 75%. The left ventricle has hyperdynamic function. There is mildly increased left ventricular hypertrophy. Grade I diastolic dysfunction (impaired relaxation). Trivial MR and TR. The aortic valve has an indeterminant number of cusps. Aortic valve not well visualized; elevated gradient across aortic valve may partially be explained by vigorous LV functon; cannot R/O mild AS.  '20 Cath - Mid RCA to Dist RCA lesion is 100% stenosed. 1st Diag-1 lesion is 99% stenosed. 1st Diag-2 lesion is 95% stenosed. 2nd Mrg lesion is 80% stenosed. Mid Cx lesion is 95% stenosed. Prox Cx lesion is 70% stenosed. Prox LAD lesion is 90% stenosed. The left ventricular systolic function is normal. LV end diastolic pressure is normal. 3rd Mrg lesion is 70% stenosed.    Neuro/Psych PSYCHIATRIC DISORDERS Anxiety negative neurological ROS     GI/Hepatic negative GI ROS, Neg liver ROS,   Endo/Other  diabetes, Type 2Morbid obesity  Renal/GU negative Renal ROS     Musculoskeletal  (+) Fibromyalgia -  Abdominal   Peds  Hematology negative hematology ROS (+)   Anesthesia Other Findings Covid neg 12/9  Reproductive/Obstetrics                            Anesthesia Physical Anesthesia Plan  ASA:  IV  Anesthesia Plan: General   Post-op Pain Management:    Induction: Intravenous  PONV Risk Score and Plan: 3 and Treatment may vary due to age or medical condition  Airway Management Planned: Oral ETT  Additional Equipment: Arterial line, CVP, PA Cath, TEE and Ultrasound Guidance Line Placement  Intra-op Plan:   Post-operative Plan: Post-operative intubation/ventilation  Informed Consent: I have reviewed the patients History and Physical, chart, labs and discussed the procedure including the risks, benefits and alternatives for the proposed anesthesia with the patient or authorized representative who has indicated his/her understanding and acceptance.     Dental advisory given  Plan Discussed with: CRNA and Anesthesiologist  Anesthesia Plan Comments:        Anesthesia Quick Evaluation

## 2019-04-13 NOTE — Care Management (Signed)
Per Inez Catalina W/[facility] @[phone]  Co-pay amount for Jardiance 10mg  daily $25.00 for a 30 day supply. Co-pay for  Jardiance 10mg . mail order pharmacy 90 day supply $75.00.  PA required @ [phone] No Deductible Tier: 3 Retail pharmacies are: [facility].  Mail order pharmacy :[facility].

## 2019-04-13 NOTE — H&P (Addendum)
Cardiology Admission History and Physical:   Patient ID: Michele [name] MRN: [id number]; DOB: 1961/02/17   Admission date: 04/12/2019  Primary Care Provider: Agapito Games, MD Primary Cardiologist: No primary care provider on file.  Primary Electrophysiologist:  None   Chief Complaint: Chest pain  Patient Profile:   Michele [name] is a 58 y.o. female with no reported past medical history who presented with chest pain the midsternal point.  Found to have elevated troponin and transferred to [facility] for further work-up.  History of Present Illness:   Michele [name] is a 57 year old female with no reported past medical history.  States she has been tried on blood pressure medication in the past but has had issues with this dropping her pressures too low, and were therefore stopped.  She is a smoker.  Denies any family history of CAD.  States over the past 2 to 3 weeks she has had several episodes of having a sensation of feeling like something is stuck in her throat.  This has happened several times after eating.  States she has struggled with symptoms of acid reflux in the past, but has been several years without having any symptoms.  Some episodes have been associated with eating spicy foods, and pizza.  Yesterday morning around 645 she ate a piece of chocolate, and went to get in her car to go to work.  Developed sensation of something stuck in her throat which radiated through to her back.  Sat for several minutes and symptoms eased.  Was able to contract for her and once getting there had return of sensation with chest heaviness.  States this lasted for several hours which concerned her and she presented to med [facility] for further evaluation.  In the ED her labs showed Na+ 134, K+ 3.4, Cr 1.11, HsT 260>>449, LDL unable to be calculated. Blood pressures were noted >200 systolic. EKG showed ST with ST sagging in inferolateral leads. She was placed on IV heparin and nitro.  Transferred to [facility] for further management.   Heart Pathway Score:     Past Medical History:  Diagnosis Date  . Fibromyalgia     Past Surgical History:  Procedure Laterality Date  . TONSILLECTOMY       Medications Prior to Admission: Prior to Admission medications   Medication Sig Start Date End Date Taking? Authorizing Provider  aspirin-acetaminophen-caffeine (EXCEDRIN MIGRAINE) [phone] MG tablet Take 1 tablet by mouth every 6 (six) hours as needed for headache.   Yes [provider]     Allergies:   No Known Allergies  Social History:   Social History   Socioeconomic History  . Marital status: Single    Spouse name: Not on file  . Number of children: Not on file  . Years of education: Not on file  . Highest education level: Not on file  Occupational History  . Not on file  Tobacco Use  . Smoking status: Current Every Day Smoker  . Smokeless tobacco: Never Used  Substance and Sexual Activity  . Alcohol use: Not on file  . Drug use: Not on file  . Sexual activity: Not on file  Other Topics Concern  . Not on file  Social History Narrative  . Not on file   Social Determinants of Health   Financial Resource Strain:   . Difficulty of Paying Living Expenses: Not on file  Food Insecurity:   . Worried About Programme researcher, broadcasting/film/video in the Last Year: Not on  file  . Ran Out of Food in the Last Year: Not on file  Transportation Needs:   . Lack of Transportation (Medical): Not on file  . Lack of Transportation (Non-Medical): Not on file  Physical Activity:   . Days of Exercise per Week: Not on file  . Minutes of Exercise per Session: Not on file  Stress:   . Feeling of Stress : Not on file  Social Connections:   . Frequency of Communication with Friends and Family: Not on file  . Frequency of Social Gatherings with Friends and Family: Not on file  . Attends Religious Services: Not on file  . Active Member of Clubs or Organizations: Not on file  . Attends  BankerClub or Organization Meetings: Not on file  . Marital Status: Not on file  Intimate Partner Violence:   . Fear of Current or Ex-Partner: Not on file  . Emotionally Abused: Not on file  . Physically Abused: Not on file  . Sexually Abused: Not on file    Family History:   The patient's family history is not on file.    ROS:  Please see the history of present illness.  All other ROS reviewed and negative.     Physical Exam/Data:   Vitals:   04/12/19 2314 04/12/19 2344 04/13/19 0015 04/13/19 0536  BP: (!) 176/92 (!) 179/86 (!) 174/100 (!) 195/86  Pulse: 92 87 89 92  Resp: 14 14 15 18   Temp:    98.4 F (36.9 C)  TempSrc:    Oral  SpO2: 91% 93% 94% 98%  Weight:      Height:        Intake/Output Summary (Last 24 hours) at 04/13/2019 0732 Last data filed at 04/13/2019 0600 Gross per 24 hour  Intake 308.43 ml  Output -  Net 308.43 ml   Last 3 Weights 04/12/2019 04/12/2019 03/02/2008  Weight (lbs) 281 lb 8.4 oz 260 lb 250 lb  Weight (kg) 127.7 kg 117.935 kg 113.399 kg     Body mass index is 48.32 kg/m.  General:  Well nourished, well developed, obese in no acute distress HEENT: normal Lymph: no adenopathy Neck: no JVD Endocrine:  No thryomegaly Vascular: No carotid bruits Cardiac:  normal S1, S2; RRR; no murmur  Lungs:  clear to auscultation bilaterally, no wheezing, rhonchi or rales  Abd: soft, nontender, no hepatomegaly  Ext: no edema Musculoskeletal:  No deformities, BUE and BLE strength normal and equal Skin: warm and dry  Neuro:  CNs 2-12 intact, no focal abnormalities noted Psych:  Normal affect   EKG:  The ECG that was done 04/12/19 was personally reviewed and demonstrates ST with ST sagging in inferolateral leads  Relevant CV Studies:  N/a   Laboratory Data:  High Sensitivity Troponin:   Recent Labs  Lab 04/12/19 0917 04/12/19 1145  TROPONINIHS 260* 449*      Chemistry Recent Labs  Lab 04/12/19 0917  NA 134*  K 3.4*  CL 101  CO2 23  GLUCOSE  204*  BUN 15  CREATININE 1.11*  CALCIUM 9.9  GFRNONAA 55*  GFRAA >60  ANIONGAP 10    Recent Labs  Lab 04/12/19 0917  PROT 7.7  ALBUMIN 3.8  AST 26  ALT 29  ALKPHOS 93  BILITOT 0.5   Hematology Recent Labs  Lab 04/12/19 0917 04/13/19 0434  WBC 7.8 8.1  RBC 5.07 4.55  HGB 14.8 13.2  HCT 45.0 40.5  MCV 88.8 89.0  MCH 29.2 29.0  MCHC 32.9  32.6  RDW 14.4 14.6  PLT 245 235   BNPNo results for input(s): BNP, PROBNP in the last 168 hours.  DDimer No results for input(s): DDIMER in the last 168 hours.   Radiology/Studies:  DG Chest Port 1 View  Result Date: 04/12/2019 CLINICAL DATA:  Hypertension.  Acute coronary syndrome EXAM: PORTABLE CHEST 1 VIEW COMPARISON:  None FINDINGS: Artifact from EKG leads. Normal heart size and mediastinal contours. Mild interstitial coarsening. There is no edema, consolidation, effusion, or pneumothorax. IMPRESSION: No evidence of acute disease. Electronically Signed   By: Marnee Spring M.D.   On: 04/12/2019 09:42    Assessment and Plan:   Michele [name] is a 58 y.o. female with no reported past medical history who presented with chest pain the midsternal point.  Found to have elevated troponin and transferred to [facility] for further work-up.  1. Chest pain: has had several episodes of having the sensation of something stuck in her throat. Some episodes have been with eating, but yesterday had an event while at work in the absence of eating that last for 2-3 hours. HsT 260>>>449. Placed on IV heparin and nitro. No further pain. She is a smoker, suspect she does not visit a PCP on regular basis. Would benefit from definitive cath to rule out CAD. -- The patient understands that risks included but are not limited to stroke (1 in 1000), death (1 in 1000), kidney failure [usually temporary] (1 in 500), bleeding (1 in 200), allergic reaction [possibly serious] (1 in 200).  -- continue IV heparin and nitro -- check Hgb A1c, recheck fasting lipids   2. HTN: Blood pressures severely elevated on admission. Still up today.  -- start coreg 3.125mg  BID today and titrate as appropriate  3. Hypokalemia: recheck BMET this morning  4. Tobacco use: cessation advised.  Severity of Illness: The appropriate patient status for this patient is OBSERVATION. Observation status is judged to be reasonable and necessary in order to provide the required intensity of service to ensure the patient's safety. The patient's presenting symptoms, physical exam findings, and initial radiographic and laboratory data in the context of their medical condition is felt to place them at decreased risk for further clinical deterioration. Furthermore, it is anticipated that the patient will be medically stable for discharge from the hospital within 2 midnights of admission. The following factors support the patient status of observation.   " The patient's presenting symptoms include chest pain. " The physical exam findings include stable exam. " The initial radiographic and laboratory data are + HsT.  For questions or updates, please contact [facility] Please consult [email or web address] for contact info under    Signed, Laverda Page, NP  04/13/2019 7:32 AM   I have personally seen and examined this patient. I agree with the assessment and plan as outlined above.  She arrived here last night at 10 pm but our team was not alerted of her arrival.  She has a history of morbid obesity and tobacco abuse.  She has been having chest pain for 2-3 weeks. Some of it seems atypical and related to meals. Troponin is elevated. BP over 200 systolic on arrival to ED yesterday. EKG is reviewed by me and shows sinus tach with changes c/w LVH and inferior/anterolateral ST depression.  My exam:  General: Morbidly obese female. NAD  HEENT: OP clear, mucus membranes moist  SKIN: warm, dry. No rashes. Neuro: No focal deficits  Musculoskeletal: Muscle strength 5/5 all ext  Psychiatric:  Mood  and affect normal  Neck: No JVD, no carotid bruits, no thyromegaly, no lymphadenopathy.  Lungs:Clear bilaterally, no wheezes, rhonci, crackles Cardiovascular: Regular rate and rhythm. No murmurs, gallops or rubs. Abdomen:Soft. Bowel sounds present. Non-tender.  Extremities: No lower extremity edema. Pulses are 2 + in the bilateral DP/PT.  Plan: Hypertensive urgency with elevated troponin, abnormal EKG and concern for unstable angina. I think a cardiac cath is indicated to exclude CAD. She has no chest pain this am. Risks factors for CAD include obesity and tobacco abuse. Will plan cardiac cath today.  I have reviewed the risks, indications, and alternatives to cardiac catheterization, possible angioplasty, and stenting with the patient. Risks include but are not limited to bleeding, infection, vascular injury, stroke, myocardial infection, arrhythmia, kidney injury, radiation-related injury in the case of prolonged fluoroscopy use, emergency cardiac surgery, and death. The patient understands the risks of serious complication is 1-2 in 8242 with diagnostic cardiac cath and 1-2% or less with angioplasty/stenting.  Lauree Chandler 04/13/2019 8:04 AM

## 2019-04-13 NOTE — Progress Notes (Signed)
Pre-CABG testing and bilateral lower extremity vein mapping has been completed. Preliminary results can be found in CV Proc through chart review.   04/13/19 2:17 PM Michele [name] RVT

## 2019-04-13 NOTE — Progress Notes (Signed)
[place] for heparin Indication: chest pain/ACS  Heparin Dosing Weight: 83.2 kg  Labs: Recent Labs    04/12/19 0917 04/12/19 1145 04/12/19 1724 04/13/19 0434  HGB 14.8  --   --  13.2  HCT 45.0  --   --  40.5  PLT 245  --   --  235  APTT 26  --   --   --   LABPROT 13.0  --   --   --   INR 1.0  --   --   --   HEPARINUNFRC  --   --  0.48 0.13*  CREATININE 1.11*  --   --   --   TROPONINIHS 260* 449*  --   --     Estimated Creatinine Clearance: 73.2 mL/min (A) (by C-G formula based on SCr of 1.11 mg/dL (H)).  Assessment: 58 yr old female presented to [place] ED with CP, elevated high-sensitivity troponin. Pharmacy consulted to dose heparin. Patient is not on anticoagulation PTA. CBC WNL Pt awaiting transfer to [facility].  Heparin level subtherapeutic at 0.13, no infusion issues and rate confirmed with RN  Goal of Therapy:  Heparin level 0.3-0.7 units/ml Monitor platelets by anticoagulation protocol: Yes   Plan:  Increase heparin gtt to 1250 units/hr F/u 6 hour heparin level  Bertis Ruddy, PharmD Clinical Pharmacist Please check AMION for all [place] numbers 04/13/2019 6:35 AM

## 2019-04-13 NOTE — Care Management (Signed)
04-13-19 Benefits check submitted for [employer]. CM will make patient aware of cost once completed. Bethena Roys, RN,BSN Case Manager [phone]

## 2019-04-13 NOTE — Consult Note (Addendum)
[address]       [place] [address]             [phone]        Michele [name] [facility] Medical Record #[id number] Date of Birth: 1960-10-05  Referring: No ref. provider found Primary Care: Agapito Games, MD Primary Cardiologist:No primary care provider on file.  Chief Complaint:    Chief Complaint  Patient presents with   Chest Pain    History of Present Illness:    The patient is a 58 year old female we are asked to see in cardiothoracic surgical consultation for consideration of CABG.  She denies any significant past medical history although she has been treated in the past for hypertension.  Reportedly medications made her blood pressure too low and they were discontinued.  She does have a history of tobacco use and is a current every day smoker.  Her BMI is notable at 48.32.  Over the past several weeks has had episodes of feeling as though something is stuck in her throat. It is associated with chest pain.  This has happened on multiple occasions after eating.  She has a history of acid reflux in the past.  On occasion the symptoms have radiated to her back.  One occasion was particularly worrisome  and she presented to the med [facility] for further evaluation.  She was noted to have elevated high sensitive troponins and EKG showed some slight ST sagging in the inferolateral leads.  She was placed on intravenous heparin and nitroglycerin and transferred to [facility] for further management to include cardiology consultation.  Additionally it was notable in the ER that her systolic blood pressure was greater than 200.  She was felt to be a candidate to proceed with urgent cardiac catheterization as it was felt the symptoms worse significant for unstable angina with hypertensive urgency in the setting of positive troponins.  Cardiac catheterization revealed severe three-vessel coronary artery disease with preserved left ventricular function.   Please see the full report below.  Routine preoperative cardiac surgical studies have been ordered.  Additionally we are obtaining a vein mapping.    Current Activity/ Functional Status: Patient is independent with mobility/ambulation, transfers, ADL's, IADL's.   Zubrod Score: At the time of surgery this patients most appropriate activity status/level should be described as:     0    Normal activity, no symptoms     1    Restricted in physical strenuous activity but ambulatory, able to do out light work     2    Ambulatory and capable of self care, unable to do work activities, up and about                 more than 50%  Of the time                                3    Only limited self care, in bed greater than 50% of waking hours     4    Completely disabled, no self care, confined to bed or chair     5    Moribund  Past Medical History:  Diagnosis Date   Fibromyalgia     Past Surgical History:  Procedure Laterality Date   TONSILLECTOMY      Social History   Tobacco Use  Smoking Status Current Every Day  Smoker  Smokeless Tobacco Never Used    Social History   Substance and Sexual Activity  Alcohol Use None  Has smoked since her 30's, + 1ppd currently.    No Known Allergies  Current Facility-Administered Medications  Medication Dose Route Frequency Provider Last Rate Last Admin   0.9 %  sodium chloride infusion   Intravenous Continuous Renne CriglerGeiple, Joshua, PA-C       0.9 %  sodium chloride infusion   Intravenous Continuous Lennette BihariKelly, Thomas A, MD 150 mL/hr at 04/13/19 1225 New Bag at 04/13/19 1225   0.9 %  sodium chloride infusion  250 mL Intravenous PRN Lennette BihariKelly, Thomas A, MD       acetaminophen (TYLENOL) tablet 650 mg  650 mg Oral Q4H PRN Lennette BihariKelly, Thomas A, MD       [START ON 04/14/2019] aspirin chewable tablet 81 mg  81 mg Oral Daily Lennette BihariKelly, Thomas A, MD       atorvastatin (LIPITOR) tablet 80 mg  80 mg Oral q1800 Lennette BihariKelly, Thomas A, MD       carvedilol (COREG)  tablet 3.125 mg  3.125 mg Oral BID WC Laverda Pageoberts, Lindsay B, NP   3.125 mg at 04/13/19 0830   diazepam (VALIUM) tablet 5 mg  5 mg Oral Q6H PRN Lennette BihariKelly, Thomas A, MD       heparin ADULT infusion 100 units/mL (25000 units/26650mL sodium chloride 0.45%)  1,250 Units/hr Intravenous Continuous Kathleene HazelMcAlhany, Christopher D, MD       hydrALAZINE (APRESOLINE) injection 10 mg  10 mg Intravenous Q20 Min PRN Lennette BihariKelly, Thomas A, MD       insulin aspart (novoLOG) injection 0-15 Units  0-15 Units Subcutaneous TID WC Kathleene HazelMcAlhany, Christopher D, MD       insulin aspart (novoLOG) injection 0-5 Units  0-5 Units Subcutaneous QHS Kathleene HazelMcAlhany, Christopher D, MD       labetalol (NORMODYNE) injection 10 mg  10 mg Intravenous Q10 min PRN Lennette BihariKelly, Thomas A, MD       morphine 4 MG/ML injection 4 mg  4 mg Intravenous Q1H PRN Renne CriglerGeiple, Joshua, PA-C   4 mg at 04/13/19 0646   mupirocin ointment (BACTROBAN) 2 % 1 application  1 application Nasal BID Kathleene HazelMcAlhany, Christopher D, MD       nitroGLYCERIN 50 mg in dextrose 5 % 250 mL (0.2 mg/mL) infusion  0-200 mcg/min Intravenous Titrated Lennette BihariKelly, Thomas A, MD       ondansetron [facility](ZOFRAN) injection 4 mg  4 mg Intravenous Q6H PRN Lennette BihariKelly, Thomas A, MD       sodium chloride flush (NS) 0.9 % injection 3 mL  3 mL Intravenous Q12H Lennette BihariKelly, Thomas A, MD       sodium chloride flush (NS) 0.9 % injection 3 mL  3 mL Intravenous PRN Lennette BihariKelly, Thomas A, MD        Medications Prior to Admission  Medication Sig Dispense Refill Last Dose   aspirin-acetaminophen-caffeine (EXCEDRIN MIGRAINE) 250-250-65 MG tablet Take 1 tablet by mouth every 6 (six) hours as needed for headache.       History reviewed. No pertinent family history.   Review of Systems:   Review of Systems  Constitutional: Positive for chills and diaphoresis. Negative for fever, malaise/fatigue and weight loss.  HENT: Positive for congestion and sinus pain. Negative for ear discharge, ear pain, hearing loss, nosebleeds, sore throat and tinnitus.     Eyes: Positive for photophobia. Negative for blurred vision, double vision, pain, discharge and redness.  Respiratory: Positive for shortness of breath. Negative for cough,  hemoptysis, sputum production, wheezing and stridor.   Cardiovascular: Positive for chest pain, palpitations and claudication. Negative for orthopnea, leg swelling and PND.  Gastrointestinal: Positive for heartburn and nausea. Negative for abdominal pain, blood in stool, constipation, diarrhea, melena and vomiting.  Genitourinary: Positive for urgency. Negative for dysuria, flank pain, frequency and hematuria.  Musculoskeletal: Positive for back pain, joint pain, myalgias and neck pain. Negative for falls.  Skin: Negative for itching and rash.  Neurological: Positive for tingling, sensory change and headaches. Negative for dizziness, tremors, speech change, focal weakness, seizures, loss of consciousness and weakness.  Endo/Heme/Allergies: Positive for environmental allergies. Negative for polydipsia. Bruises/bleeds easily.  Psychiatric/Behavioral: Positive for depression and memory loss. Negative for hallucinations, substance abuse and suicidal ideas. The patient is nervous/anxious and has insomnia.         Physical Exam: BP (!) 147/94    Pulse 97    Temp 98.4 F (36.9 C) (Oral)    Resp 11    Ht 5\' 4"  (1.626 m)    Wt 127.7 kg    SpO2 92%    BMI 48.32 kg/m   Physical Exam  Constitutional: She appears healthy. No distress.  HENT:  Nose: No nasal discharge.  Mouth/Throat: Dental caries present. Oropharynx is clear. Pharynx is normal.  Eyes: Pupils are equal, round, and reactive to light. Conjunctivae are normal.  Neck: Thyroid normal. No JVD present. No neck adenopathy. No thyromegaly present.  Cardiovascular: Normal rate, regular rhythm and normal heart sounds. Exam reveals no gallop.  No murmur heard. Pulses:      Carotid pulses are 2+ on the right side and 2+ on the left side.      Radial pulses are 2+ on the  right side and 2+ on the left side.       Dorsalis pedis pulses are 0 on the right side and 0 on the left side.       Posterior tibial pulses are 0 on the right side and 0 on the left side.  Pulmonary/Chest: Breath sounds normal. She has no wheezes. She has no rales. She exhibits no tenderness.  Abdominal: Soft. Bowel sounds are normal. She exhibits no distension and no mass. There is no hepatomegaly. There is no abdominal tenderness.  obese  Musculoskeletal:        General: No tenderness, deformity or edema.     Cervical back: Normal range of motion and neck supple.  Neurological: She is alert and oriented to person, place, and time. She has normal motor skills.  Skin: Skin is warm and dry. No rash noted. No cyanosis. No jaundice, pallor or plethora. Nails show no clubbing.    Diagnostic Studies & Laboratory data:     Recent Radiology Findings:  DG Chest Port 1 View  Result Date: 04/12/2019 CLINICAL DATA:  Hypertension.  Acute coronary syndrome EXAM: PORTABLE CHEST 1 VIEW COMPARISON:  None FINDINGS: Artifact from EKG leads. Normal heart size and mediastinal contours. Mild interstitial coarsening. There is no edema, consolidation, effusion, or pneumothorax. IMPRESSION: No evidence of acute disease. Electronically Signed   By: Monte Fantasia M.D.   On: 04/12/2019 09:42     I have independently reviewed the above radiologic studies and discussed with the patient   Recent Lab Findings: Lab Results  Component Value Date   WBC 8.1 04/13/2019   HGB 13.2 04/13/2019   HCT 40.5 04/13/2019   PLT 235 04/13/2019   GLUCOSE 160 (H) 04/13/2019   CHOL 209 (H) 04/13/2019   TRIG  274 (H) 04/13/2019   HDL 30 (L) 04/13/2019   LDLCALC 124 (H) 04/13/2019   ALT 29 04/12/2019   AST 26 04/12/2019   NA 139 04/13/2019   K 3.6 04/13/2019   CL 102 04/13/2019   CREATININE 1.09 (H) 04/13/2019   BUN 13 04/13/2019   CO2 28 04/13/2019   TSH 3.269 03/02/2008   INR 1.0 04/12/2019   HGBA1C 7.8 (H) 04/13/2019    LEFT HEART CATH AND CORONARY ANGIOGRAPHY  Conclusion    Mid RCA to Dist RCA lesion is 100% stenosed.  1st Diag-1 lesion is 99% stenosed.  1st Diag-2 lesion is 95% stenosed.  2nd Mrg lesion is 80% stenosed.  Mid Cx lesion is 95% stenosed.  Prox Cx lesion is 70% stenosed.  Prox LAD lesion is 90% stenosed.  The left ventricular systolic function is normal.  LV end diastolic pressure is normal.  3rd Mrg lesion is 70% stenosed.   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  There is very faint collateralization from the proximal SA branch as well as from the distal circumflex supplying distal branches of the RCA.  Preserved global LV contractility with EF estimated 60 to 65% without significant focal segmental wall motion abnormality.  LVEDP 10 to 12 mmHg.  RECOMMENDATION: Surgical consultation for CABG revascularization.  The patient is currently on IV nitroglycerin.  Will resume heparin 10 hours post sheath removal.  Aggressive lipid-lowering therapy good LDL less than 70. Optimal blood pressure control.  Smoking cessation and weight loss are essential.     Indications  Coronary artery disease involving native coronary artery of native heart with other form of angina pectoris (HCC) [I25.118 (ICD-10-CM)]   Diagnostic Dominance: Right Left Anterior Descending  Prox LAD lesion 90% stenosed  Prox LAD lesion is 90% stenosed.  First Diagonal Branch  1st Diag-1 lesion 99% stenosed  1st Diag-1 lesion is 99% stenosed.  1st Diag-2 lesion 95% stenosed  1st Diag-2 lesion is 95% stenosed.  First Septal  Branch  Collaterals  1st Sept filled by collaterals from 3rd RPL.    Left Circumflex  Prox Cx lesion 70% stenosed  Prox Cx lesion is 70% stenosed.  Mid Cx lesion 95% stenosed  Mid Cx lesion is 95% stenosed.  Second Obtuse Marginal Branch  2nd Mrg lesion 80% stenosed  2nd Mrg lesion is 80% stenosed.  Third Obtuse Marginal Branch  3rd Mrg lesion 70% stenosed  3rd Mrg lesion is 70% stenosed.  Right Coronary Artery  Mid RCA to Dist RCA lesion 100% stenosed  Mid RCA to Dist RCA lesion is 100% stenosed.  Right Posterior Atrioventricular Artery  Collaterals  RPAV filled by collaterals from 2nd Mrg.    Intervention  No interventions have been documented. Wall Motion  Resting               Left Heart  Left Ventricle The left ventricular size is normal. The left ventricular systolic function is normal. LV end diastolic pressure is normal. No regional wall motion abnormalities. There is normal global LV function with EF estimated at 60 to 65%.  LVEDP 10 to 12  mmHg  Coronary Diagrams  Diagnostic Dominance: Right    Assessment / Plan:  Severe 3 vessel CAD,recent NSTEMI      Tobacco abuse    PAD    HTN    Obesity BMI greater than 48    fibromyalgia     Plan: CABG   Rowe Clack, PA-C 04/13/2019 12:34 PM  I have seen the patient reviewed her history reviewed her cardiac catheterization and chest x-ray and echocardiogram.  She has increased risk for coronary artery bypass grafting because of severe obesity and long-term smoking history however with her high-grade coronary disease, symptoms on admission, non-STEMI myocardial infarction and severe three-vessel coronary artery disease coronary artery bypass grafting offers the best treatment option.   The goals risks and alternatives of the planned surgical procedure Procedure(s): LEFT HEART CATH AND CORONARY ANGIOGRAPHY (N/A)  have been discussed with the patient in detail. The risks of the procedure including death,  infection, stroke, myocardial infarction, bleeding, blood transfusion have all been discussed specifically.  I have quoted Michele [name] a 60% of perioperative mortality and a complication rate as high as 5 %. The patient's questions have been answered.Michele [name] is willing  to proceed with the planned procedure.   Delight Ovens MD      [address] [place] [place] [address] Office [phone]

## 2019-04-13 NOTE — Progress Notes (Signed)
  Echocardiogram 2D Echocardiogram has been performed.  Michele [name] G Frankie Zito 04/13/2019, 4:40 PM

## 2019-04-14 ENCOUNTER — Inpatient Hospital Stay (HOSPITAL_COMMUNITY): Payer: Managed Care, Other (non HMO) | Admitting: Certified Registered Nurse Anesthetist

## 2019-04-14 ENCOUNTER — Inpatient Hospital Stay (HOSPITAL_COMMUNITY): Payer: Managed Care, Other (non HMO)

## 2019-04-14 ENCOUNTER — Observation Stay (HOSPITAL_COMMUNITY): Payer: Managed Care, Other (non HMO)

## 2019-04-14 ENCOUNTER — Encounter (HOSPITAL_COMMUNITY): Payer: Self-pay | Admitting: Cardiovascular Disease

## 2019-04-14 ENCOUNTER — Encounter (HOSPITAL_COMMUNITY)
Admission: EM | Disposition: A | Discharge status: Home / Self Care | Payer: Self-pay | Source: Home / Self Care | Attending: Cardiothoracic Surgery

## 2019-04-14 DIAGNOSIS — I214 Non-ST elevation (NSTEMI) myocardial infarction: Secondary | ICD-10-CM | POA: Diagnosis present

## 2019-04-14 DIAGNOSIS — E785 Hyperlipidemia, unspecified: Secondary | ICD-10-CM | POA: Diagnosis not present

## 2019-04-14 DIAGNOSIS — Z23 Encounter for immunization: Secondary | ICD-10-CM | POA: Diagnosis not present

## 2019-04-14 DIAGNOSIS — I4891 Unspecified atrial fibrillation: Secondary | ICD-10-CM | POA: Diagnosis present

## 2019-04-14 DIAGNOSIS — Z951 Presence of aortocoronary bypass graft: Secondary | ICD-10-CM

## 2019-04-14 DIAGNOSIS — I161 Hypertensive emergency: Secondary | ICD-10-CM | POA: Diagnosis present

## 2019-04-14 DIAGNOSIS — R778 Other specified abnormalities of plasma proteins: Secondary | ICD-10-CM | POA: Diagnosis present

## 2019-04-14 DIAGNOSIS — F17200 Nicotine dependence, unspecified, uncomplicated: Secondary | ICD-10-CM | POA: Diagnosis present

## 2019-04-14 DIAGNOSIS — D696 Thrombocytopenia, unspecified: Secondary | ICD-10-CM | POA: Diagnosis present

## 2019-04-14 DIAGNOSIS — E119 Type 2 diabetes mellitus without complications: Secondary | ICD-10-CM | POA: Diagnosis present

## 2019-04-14 DIAGNOSIS — I973 Postprocedural hypertension: Secondary | ICD-10-CM | POA: Diagnosis not present

## 2019-04-14 DIAGNOSIS — I25110 Atherosclerotic heart disease of native coronary artery with unstable angina pectoris: Secondary | ICD-10-CM | POA: Diagnosis present

## 2019-04-14 DIAGNOSIS — M797 Fibromyalgia: Secondary | ICD-10-CM | POA: Diagnosis present

## 2019-04-14 DIAGNOSIS — I200 Unstable angina: Secondary | ICD-10-CM | POA: Diagnosis present

## 2019-04-14 DIAGNOSIS — I480 Paroxysmal atrial fibrillation: Secondary | ICD-10-CM | POA: Diagnosis not present

## 2019-04-14 DIAGNOSIS — Z20828 Contact with and (suspected) exposure to other viral communicable diseases: Secondary | ICD-10-CM | POA: Diagnosis present

## 2019-04-14 DIAGNOSIS — E876 Hypokalemia: Secondary | ICD-10-CM | POA: Diagnosis present

## 2019-04-14 DIAGNOSIS — I10 Essential (primary) hypertension: Secondary | ICD-10-CM | POA: Diagnosis present

## 2019-04-14 DIAGNOSIS — D62 Acute posthemorrhagic anemia: Secondary | ICD-10-CM | POA: Diagnosis not present

## 2019-04-14 DIAGNOSIS — Z6841 Body Mass Index (BMI) 40.0 and over, adult: Secondary | ICD-10-CM | POA: Diagnosis not present

## 2019-04-14 HISTORY — PX: STERNAL CLOSURE: SHX6203

## 2019-04-14 HISTORY — PX: APPLICATION OF WOUND VAC: SHX5189

## 2019-04-14 HISTORY — PX: TEE WITHOUT CARDIOVERSION: SHX5443

## 2019-04-14 HISTORY — PX: CORONARY ARTERY BYPASS GRAFT: SHX141

## 2019-04-14 LAB — URINALYSIS, ROUTINE W REFLEX MICROSCOPIC
Bilirubin Urine: NEGATIVE
Glucose, UA: NEGATIVE mg/dL
Ketones, ur: NEGATIVE mg/dL
Nitrite: NEGATIVE
Protein, ur: NEGATIVE mg/dL
Specific Gravity, Urine: 1.02 (ref 1.005–1.030)
pH: 6 (ref 5.0–8.0)

## 2019-04-14 LAB — BLOOD GAS, ARTERIAL
Acid-Base Excess: 2.3 mmol/L — ABNORMAL HIGH (ref 0.0–2.0)
Bicarbonate: 27.3 mmol/L (ref 20.0–28.0)
FIO2: 28
O2 Saturation: 97.8 %
Patient temperature: 37
pCO2 arterial: 50.2 mmHg — ABNORMAL HIGH (ref 32.0–48.0)
pH, Arterial: 7.355 (ref 7.350–7.450)
pO2, Arterial: 107 mmHg (ref 83.0–108.0)

## 2019-04-14 LAB — CBC
HCT: 37.4 % (ref 36.0–46.0)
HCT: 35.3 % — ABNORMAL LOW (ref 36.0–46.0)
HCT: 36.7 % (ref 36.0–46.0)
Hemoglobin: 12 g/dL (ref 12.0–15.0)
Hemoglobin: 11.4 g/dL — ABNORMAL LOW (ref 12.0–15.0)
Hemoglobin: 11.7 g/dL — ABNORMAL LOW (ref 12.0–15.0)
MCH: 28.5 pg (ref 26.0–34.0)
MCH: 29.2 pg (ref 26.0–34.0)
MCH: 29 pg (ref 26.0–34.0)
MCHC: 32.1 g/dL (ref 30.0–36.0)
MCHC: 32.3 g/dL (ref 30.0–36.0)
MCHC: 31.9 g/dL (ref 30.0–36.0)
MCV: 88.8 fL (ref 80.0–100.0)
MCV: 90.3 fL (ref 80.0–100.0)
MCV: 91.1 fL (ref 80.0–100.0)
Platelets: 212 10*3/uL (ref 150–400)
Platelets: 115 10*3/uL — ABNORMAL LOW (ref 150–400)
Platelets: 140 10*3/uL — ABNORMAL LOW (ref 150–400)
RBC: 4.21 MIL/uL (ref 3.87–5.11)
RBC: 3.91 MIL/uL (ref 3.87–5.11)
RBC: 4.03 MIL/uL (ref 3.87–5.11)
RDW: 14.6 % (ref 11.5–15.5)
RDW: 14.6 % (ref 11.5–15.5)
RDW: 14.6 % (ref 11.5–15.5)
WBC: 9.1 10*3/uL (ref 4.0–10.5)
WBC: 9.2 10*3/uL (ref 4.0–10.5)
WBC: 9 10*3/uL (ref 4.0–10.5)
nRBC: 0 % (ref 0.0–0.2)
nRBC: 0 % (ref 0.0–0.2)
nRBC: 0 % (ref 0.0–0.2)

## 2019-04-14 LAB — GLUCOSE, CAPILLARY
Glucose-Capillary: 116 mg/dL — ABNORMAL HIGH (ref 70–99)
Glucose-Capillary: 126 mg/dL — ABNORMAL HIGH (ref 70–99)
Glucose-Capillary: 130 mg/dL — ABNORMAL HIGH (ref 70–99)
Glucose-Capillary: 138 mg/dL — ABNORMAL HIGH (ref 70–99)
Glucose-Capillary: 140 mg/dL — ABNORMAL HIGH (ref 70–99)
Glucose-Capillary: 141 mg/dL — ABNORMAL HIGH (ref 70–99)
Glucose-Capillary: 142 mg/dL — ABNORMAL HIGH (ref 70–99)
Glucose-Capillary: 147 mg/dL — ABNORMAL HIGH (ref 70–99)

## 2019-04-14 LAB — MAGNESIUM: Magnesium: 2.9 mg/dL — ABNORMAL HIGH (ref 1.7–2.4)

## 2019-04-14 LAB — BASIC METABOLIC PANEL
Anion gap: 10 (ref 5–15)
Anion gap: 6 (ref 5–15)
BUN: 10 mg/dL (ref 6–20)
BUN: 9 mg/dL (ref 6–20)
CO2: 25 mmol/L (ref 22–32)
CO2: 21 mmol/L — ABNORMAL LOW (ref 22–32)
Calcium: 8.4 mg/dL — ABNORMAL LOW (ref 8.9–10.3)
Calcium: 7 mg/dL — ABNORMAL LOW (ref 8.9–10.3)
Chloride: 102 mmol/L (ref 98–111)
Chloride: 112 mmol/L — ABNORMAL HIGH (ref 98–111)
Creatinine, Ser: 0.99 mg/dL (ref 0.44–1.00)
Creatinine, Ser: 0.92 mg/dL (ref 0.44–1.00)
GFR calc Af Amer: >60 mL/min (ref >60)
GFR calc Af Amer: >60 mL/min (ref >60)
GFR calc non Af Amer: >60 mL/min (ref >60)
GFR calc non Af Amer: >60 mL/min (ref >60)
Glucose, Bld: 162 mg/dL — ABNORMAL HIGH (ref 70–99)
Glucose, Bld: 130 mg/dL — ABNORMAL HIGH (ref 70–99)
Potassium: 3.4 mmol/L — ABNORMAL LOW (ref 3.5–5.1)
Potassium: 4.3 mmol/L (ref 3.5–5.1)
Sodium: 137 mmol/L (ref 135–145)
Sodium: 139 mmol/L (ref 135–145)

## 2019-04-14 LAB — POCT I-STAT 7, (LYTES, BLD GAS, ICA,H+H)
Acid-base deficit: 2 mmol/L (ref 0.0–2.0)
Acid-base deficit: 5 mmol/L — ABNORMAL HIGH (ref 0.0–2.0)
Acid-base deficit: 5 mmol/L — ABNORMAL HIGH (ref 0.0–2.0)
Bicarbonate: 24.5 mmol/L (ref 20.0–28.0)
Bicarbonate: 20.6 mmol/L (ref 20.0–28.0)
Bicarbonate: 20.3 mmol/L (ref 20.0–28.0)
Calcium, Ion: 1.1 mmol/L — ABNORMAL LOW (ref 1.15–1.40)
Calcium, Ion: 1.03 mmol/L — ABNORMAL LOW (ref 1.15–1.40)
Calcium, Ion: 1.04 mmol/L — ABNORMAL LOW (ref 1.15–1.40)
HCT: 35 % — ABNORMAL LOW (ref 36.0–46.0)
HCT: 33 % — ABNORMAL LOW (ref 36.0–46.0)
HCT: 33 % — ABNORMAL LOW (ref 36.0–46.0)
Hemoglobin: 11.9 g/dL — ABNORMAL LOW (ref 12.0–15.0)
Hemoglobin: 11.2 g/dL — ABNORMAL LOW (ref 12.0–15.0)
Hemoglobin: 11.2 g/dL — ABNORMAL LOW (ref 12.0–15.0)
O2 Saturation: 87 %
O2 Saturation: 98 %
O2 Saturation: 95 %
Patient temperature: 36.4
Patient temperature: 36.9
Patient temperature: 37
Potassium: 3.8 mmol/L (ref 3.5–5.1)
Potassium: 4.2 mmol/L (ref 3.5–5.1)
Potassium: 4.1 mmol/L (ref 3.5–5.1)
Sodium: 140 mmol/L (ref 135–145)
Sodium: 140 mmol/L (ref 135–145)
Sodium: 140 mmol/L (ref 135–145)
TCO2: 26 mmol/L (ref 22–32)
TCO2: 22 mmol/L (ref 22–32)
TCO2: 21 mmol/L — ABNORMAL LOW (ref 22–32)
pCO2 arterial: 49.2 mmHg — ABNORMAL HIGH (ref 32.0–48.0)
pCO2 arterial: 39.3 mmHg (ref 32.0–48.0)
pCO2 arterial: 37.1 mmHg (ref 32.0–48.0)
pH, Arterial: 7.301 — ABNORMAL LOW (ref 7.350–7.450)
pH, Arterial: 7.326 — ABNORMAL LOW (ref 7.350–7.450)
pH, Arterial: 7.347 — ABNORMAL LOW (ref 7.350–7.450)
pO2, Arterial: 57 mmHg — ABNORMAL LOW (ref 83.0–108.0)
pO2, Arterial: 111 mmHg — ABNORMAL HIGH (ref 83.0–108.0)
pO2, Arterial: 78 mmHg — ABNORMAL LOW (ref 83.0–108.0)

## 2019-04-14 LAB — ECHO INTRAOPERATIVE TEE
Height: 64 in
Weight: 4515.02 oz

## 2019-04-14 LAB — POCT I-STAT, CHEM 8
BUN: 8 mg/dL (ref 6–20)
Calcium, Ion: 1.09 mmol/L — ABNORMAL LOW (ref 1.15–1.40)
Chloride: 109 mmol/L (ref 98–111)
Creatinine, Ser: 0.7 mg/dL (ref 0.44–1.00)
Glucose, Bld: 120 mg/dL — ABNORMAL HIGH (ref 70–99)
HCT: 32 % — ABNORMAL LOW (ref 36.0–46.0)
Hemoglobin: 10.9 g/dL — ABNORMAL LOW (ref 12.0–15.0)
Potassium: 4.1 mmol/L (ref 3.5–5.1)
Sodium: 138 mmol/L (ref 135–145)
TCO2: 21 mmol/L — ABNORMAL LOW (ref 22–32)

## 2019-04-14 LAB — PROTIME-INR
INR: 1.5 — ABNORMAL HIGH (ref 0.8–1.2)
Prothrombin Time: 17.9 seconds — ABNORMAL HIGH (ref 11.4–15.2)

## 2019-04-14 LAB — PREGNANCY, URINE: Preg Test, Ur: NEGATIVE

## 2019-04-14 LAB — PLATELET COUNT: Platelets: 125 10*3/uL — ABNORMAL LOW (ref 150–400)

## 2019-04-14 LAB — PREPARE RBC (CROSSMATCH)

## 2019-04-14 LAB — APTT: aPTT: 33 seconds (ref 24–36)

## 2019-04-14 LAB — HEPARIN LEVEL (UNFRACTIONATED): Heparin Unfractionated: 0.15 IU/mL — ABNORMAL LOW (ref 0.30–0.70)

## 2019-04-14 SURGERY — CORONARY ARTERY BYPASS GRAFTING (CABG)
Anesthesia: General | Site: Chest

## 2019-04-14 MED ORDER — OXYCODONE HCL 5 MG PO TABS
5.0000 mg | ORAL_TABLET | ORAL | Status: DC | PRN
  Administered 2019-04-15: 5 mg via ORAL
  Administered 2019-04-15 – 2019-04-16 (×5): 10 mg via ORAL
  Filled 2019-04-14 (×4): qty 2
  Filled 2019-04-14: qty 1
  Filled 2019-04-14: qty 2

## 2019-04-14 MED ORDER — SODIUM CHLORIDE (PF) 0.9 % IJ SOLN
OROMUCOSAL | Status: DC | PRN
  Administered 2019-04-14: 10:00:00 12 mL via TOPICAL

## 2019-04-14 MED ORDER — FENTANYL CITRATE (PF) 250 MCG/5ML IJ SOLN
INTRAMUSCULAR | Status: AC
  Filled 2019-04-14: qty 5

## 2019-04-14 MED ORDER — ROCURONIUM BROMIDE 10 MG/ML (PF) SYRINGE
PREFILLED_SYRINGE | INTRAVENOUS | Status: DC | PRN
  Administered 2019-04-14: 25 mg via INTRAVENOUS
  Administered 2019-04-14: 30 mg via INTRAVENOUS
  Administered 2019-04-14 (×3): 50 mg via INTRAVENOUS
  Administered 2019-04-14: 100 mg via INTRAVENOUS

## 2019-04-14 MED ORDER — MAGNESIUM SULFATE 4 GM/100ML IV SOLN
4.0000 g | Freq: Once | INTRAVENOUS | Status: AC
  Administered 2019-04-14: 4 g via INTRAVENOUS
  Filled 2019-04-14: qty 100

## 2019-04-14 MED ORDER — LACTATED RINGERS IV SOLN
INTRAVENOUS | Status: DC | PRN
  Administered 2019-04-14: 10:00:00 via INTRAVENOUS

## 2019-04-14 MED ORDER — FENTANYL CITRATE (PF) 100 MCG/2ML IJ SOLN
INTRAMUSCULAR | Status: AC
  Filled 2019-04-14: qty 2

## 2019-04-14 MED ORDER — ALBUMIN HUMAN 5 % IV SOLN
250.0000 mL | INTRAVENOUS | Status: AC | PRN
  Administered 2019-04-14 (×2): 12.5 g via INTRAVENOUS

## 2019-04-14 MED ORDER — ACETAMINOPHEN 500 MG PO TABS
1000.0000 mg | ORAL_TABLET | Freq: Four times a day (QID) | ORAL | Status: DC
  Administered 2019-04-15 – 2019-04-17 (×9): 1000 mg via ORAL
  Filled 2019-04-14 (×9): qty 2

## 2019-04-14 MED ORDER — PHENYLEPHRINE 40 MCG/ML (10ML) SYRINGE FOR IV PUSH (FOR BLOOD PRESSURE SUPPORT)
PREFILLED_SYRINGE | INTRAVENOUS | Status: AC
  Filled 2019-04-14: qty 10

## 2019-04-14 MED ORDER — ORAL CARE MOUTH RINSE
15.0000 mL | OROMUCOSAL | Status: DC
  Administered 2019-04-14: 15 mL via OROMUCOSAL

## 2019-04-14 MED ORDER — ROCURONIUM BROMIDE 10 MG/ML (PF) SYRINGE
PREFILLED_SYRINGE | INTRAVENOUS | Status: AC
  Filled 2019-04-14: qty 10

## 2019-04-14 MED ORDER — METOPROLOL TARTRATE 5 MG/5ML IV SOLN
2.5000 mg | INTRAVENOUS | Status: DC | PRN
  Administered 2019-04-16: 5 mg via INTRAVENOUS
  Filled 2019-04-14: qty 5

## 2019-04-14 MED ORDER — TRAMADOL HCL 50 MG PO TABS: 50.0000 mg | ORAL_TABLET | ORAL | Status: DC | PRN

## 2019-04-14 MED ORDER — SODIUM CHLORIDE 0.9% FLUSH: 3.0000 mL | INTRAVENOUS | Status: DC | PRN

## 2019-04-14 MED ORDER — HEPARIN SODIUM (PORCINE) 1000 UNIT/ML IJ SOLN
INTRAMUSCULAR | Status: AC
  Filled 2019-04-14: qty 1

## 2019-04-14 MED ORDER — VANCOMYCIN HCL IN DEXTROSE 1-5 GM/200ML-% IV SOLN
1000.0000 mg | Freq: Once | INTRAVENOUS | Status: AC
  Administered 2019-04-14: 1000 mg via INTRAVENOUS
  Filled 2019-04-14: qty 200

## 2019-04-14 MED ORDER — CHLORHEXIDINE GLUCONATE 0.12% ORAL RINSE (MEDLINE KIT)
15.0000 mL | Freq: Two times a day (BID) | OROMUCOSAL | Status: DC
  Administered 2019-04-14: 15 mL via OROMUCOSAL

## 2019-04-14 MED ORDER — MIDAZOLAM HCL (PF) 10 MG/2ML IJ SOLN
INTRAMUSCULAR | Status: AC
  Filled 2019-04-14: qty 2

## 2019-04-14 MED ORDER — MORPHINE SULFATE (PF) 2 MG/ML IV SOLN
1.0000 mg | INTRAVENOUS | Status: DC | PRN
  Administered 2019-04-14 – 2019-04-15 (×4): 2 mg via INTRAVENOUS
  Filled 2019-04-14 (×4): qty 1

## 2019-04-14 MED ORDER — ALBUMIN HUMAN 5 % IV SOLN
INTRAVENOUS | Status: DC | PRN
  Administered 2019-04-14: 16:00:00 via INTRAVENOUS

## 2019-04-14 MED ORDER — PROTAMINE SULFATE 10 MG/ML IV SOLN
INTRAVENOUS | Status: DC | PRN
  Administered 2019-04-14: 450 mg via INTRAVENOUS

## 2019-04-14 MED ORDER — LACTATED RINGERS IV SOLN: 500.0000 mL | Freq: Once | INTRAVENOUS | Status: DC | PRN

## 2019-04-14 MED ORDER — MIDAZOLAM HCL 2 MG/2ML IJ SOLN: 2.0000 mg | INTRAMUSCULAR | Status: DC | PRN

## 2019-04-14 MED ORDER — PLASMA-LYTE 148 IV SOLN
INTRAVENOUS | Status: DC | PRN
  Administered 2019-04-14: 10:00:00 500 mL via INTRAVASCULAR

## 2019-04-14 MED ORDER — SODIUM CHLORIDE 0.9 % IV SOLN
INTRAVENOUS | Status: DC | PRN
  Administered 2019-04-14: 14:00:00 via INTRAVENOUS

## 2019-04-14 MED ORDER — METOPROLOL TARTRATE 25 MG/10 ML ORAL SUSPENSION: 12.5000 mg | Freq: Two times a day (BID) | ORAL | Status: DC

## 2019-04-14 MED ORDER — ALBUTEROL SULFATE HFA 108 (90 BASE) MCG/ACT IN AERS
INHALATION_SPRAY | RESPIRATORY_TRACT | Status: AC
  Filled 2019-04-14: qty 6.7

## 2019-04-14 MED ORDER — PHENYLEPHRINE HCL-NACL 20-0.9 MG/250ML-% IV SOLN: 0.0000 ug/min | INTRAVENOUS | Status: DC

## 2019-04-14 MED ORDER — SODIUM CHLORIDE 0.45 % IV SOLN
INTRAVENOUS | Status: DC | PRN
  Administered 2019-04-14: 18:00:00 via INTRAVENOUS

## 2019-04-14 MED ORDER — HEPARIN SODIUM (PORCINE) 1000 UNIT/ML IJ SOLN
INTRAMUSCULAR | Status: DC | PRN
  Administered 2019-04-14: 45000 [IU] via INTRAVENOUS

## 2019-04-14 MED ORDER — ACETAMINOPHEN 160 MG/5ML PO SOLN: 650.0000 mg | Freq: Once | ORAL | Status: AC

## 2019-04-14 MED ORDER — DEXMEDETOMIDINE HCL IN NACL 400 MCG/100ML IV SOLN
0.0000 ug/kg/h | INTRAVENOUS | Status: DC
  Administered 2019-04-14: 0.7 ug/kg/h via INTRAVENOUS
  Administered 2019-04-14: 0.5 ug/kg/h via INTRAVENOUS
  Filled 2019-04-14: qty 100

## 2019-04-14 MED ORDER — SODIUM CHLORIDE 0.9% FLUSH
3.0000 mL | Freq: Two times a day (BID) | INTRAVENOUS | Status: DC
  Administered 2019-04-15 – 2019-04-16 (×2): 3 mL via INTRAVENOUS

## 2019-04-14 MED ORDER — ASPIRIN 81 MG PO CHEW: 324.0000 mg | CHEWABLE_TABLET | Freq: Every day | ORAL | Status: DC

## 2019-04-14 MED ORDER — DOCUSATE SODIUM 100 MG PO CAPS
200.0000 mg | ORAL_CAPSULE | Freq: Every day | ORAL | Status: DC
  Administered 2019-04-15 – 2019-04-16 (×2): 200 mg via ORAL
  Filled 2019-04-14 (×2): qty 2

## 2019-04-14 MED ORDER — CHLORHEXIDINE GLUCONATE CLOTH 2 % EX PADS
6.0000 | MEDICATED_PAD | Freq: Every day | CUTANEOUS | Status: DC
  Administered 2019-04-14 – 2019-04-17 (×4): 6 via TOPICAL

## 2019-04-14 MED ORDER — ALBUTEROL SULFATE HFA 108 (90 BASE) MCG/ACT IN AERS
INHALATION_SPRAY | RESPIRATORY_TRACT | Status: DC | PRN
  Administered 2019-04-14: 6 via RESPIRATORY_TRACT

## 2019-04-14 MED ORDER — MICROFIBRILLAR COLL HEMOSTAT EX PADS
MEDICATED_PAD | CUTANEOUS | Status: DC | PRN
  Administered 2019-04-14: 1 via TOPICAL

## 2019-04-14 MED ORDER — NITROGLYCERIN IN D5W 200-5 MCG/ML-% IV SOLN: 0.0000 ug/min | INTRAVENOUS | Status: DC

## 2019-04-14 MED ORDER — FAMOTIDINE IN NACL 20-0.9 MG/50ML-% IV SOLN: 20.0000 mg | Freq: Two times a day (BID) | INTRAVENOUS | Status: DC

## 2019-04-14 MED ORDER — SODIUM CHLORIDE 0.9 % IV SOLN
1.5000 g | Freq: Two times a day (BID) | INTRAVENOUS | Status: AC
  Administered 2019-04-14 – 2019-04-16 (×4): 1.5 g via INTRAVENOUS
  Filled 2019-04-14 (×4): qty 1.5

## 2019-04-14 MED ORDER — PNEUMOCOCCAL VAC POLYVALENT 25 MCG/0.5ML IJ INJ
0.5000 mL | INJECTION | INTRAMUSCULAR | Status: AC | PRN
  Administered 2019-04-20: 0.5 mL via INTRAMUSCULAR
  Filled 2019-04-14: qty 0.5

## 2019-04-14 MED ORDER — SODIUM CHLORIDE 0.9 % IV SOLN: 250.0000 mL | INTRAVENOUS | Status: DC

## 2019-04-14 MED ORDER — ONDANSETRON HCL 4 MG/2ML IJ SOLN: 4.0000 mg | Freq: Four times a day (QID) | INTRAMUSCULAR | Status: DC | PRN

## 2019-04-14 MED ORDER — BISACODYL 10 MG RE SUPP: 10.0000 mg | Freq: Every day | RECTAL | Status: DC

## 2019-04-14 MED ORDER — ROCURONIUM BROMIDE 10 MG/ML (PF) SYRINGE
PREFILLED_SYRINGE | INTRAVENOUS | Status: AC
  Filled 2019-04-14: qty 20

## 2019-04-14 MED ORDER — DEXMEDETOMIDINE HCL IN NACL 200 MCG/50ML IV SOLN
INTRAVENOUS | Status: AC
  Filled 2019-04-14: qty 100

## 2019-04-14 MED ORDER — SODIUM CHLORIDE 0.9 % IV SOLN
INTRAVENOUS | Status: DC
  Administered 2019-04-14: 18:00:00 via INTRAVENOUS

## 2019-04-14 MED ORDER — MIDAZOLAM HCL 5 MG/5ML IJ SOLN
INTRAMUSCULAR | Status: DC | PRN
  Administered 2019-04-14 (×5): 2 mg via INTRAVENOUS

## 2019-04-14 MED ORDER — HEMOSTATIC AGENTS (NO CHARGE) OPTIME
TOPICAL | Status: DC | PRN
  Administered 2019-04-14: 1 via TOPICAL

## 2019-04-14 MED ORDER — PANTOPRAZOLE SODIUM 40 MG PO TBEC
40.0000 mg | DELAYED_RELEASE_TABLET | Freq: Every day | ORAL | Status: DC
  Administered 2019-04-16: 40 mg via ORAL
  Filled 2019-04-14: qty 1

## 2019-04-14 MED ORDER — LACTATED RINGERS IV SOLN: INTRAVENOUS | Status: DC

## 2019-04-14 MED ORDER — DEXTROSE 50 % IV SOLN: 0.0000 mL | INTRAVENOUS | Status: DC | PRN

## 2019-04-14 MED ORDER — CHLORHEXIDINE GLUCONATE 0.12 % MT SOLN
15.0000 mL | OROMUCOSAL | Status: AC
  Administered 2019-04-14: 15 mL via OROMUCOSAL

## 2019-04-14 MED ORDER — ACETAMINOPHEN 650 MG RE SUPP
650.0000 mg | Freq: Once | RECTAL | Status: AC
  Administered 2019-04-14: 650 mg via RECTAL

## 2019-04-14 MED ORDER — METOPROLOL TARTRATE 12.5 MG HALF TABLET
12.5000 mg | ORAL_TABLET | Freq: Two times a day (BID) | ORAL | Status: DC
  Administered 2019-04-15 – 2019-04-16 (×4): 12.5 mg via ORAL
  Filled 2019-04-14 (×4): qty 1

## 2019-04-14 MED ORDER — PHENYLEPHRINE HCL-NACL 10-0.9 MG/250ML-% IV SOLN
INTRAVENOUS | Status: DC | PRN
  Administered 2019-04-14: 20 ug/min via INTRAVENOUS

## 2019-04-14 MED ORDER — PROPOFOL 10 MG/ML IV BOLUS
INTRAVENOUS | Status: DC | PRN
  Administered 2019-04-14: 100 mg via INTRAVENOUS

## 2019-04-14 MED ORDER — POTASSIUM CHLORIDE 10 MEQ/50ML IV SOLN
10.0000 meq | INTRAVENOUS | Status: AC
  Administered 2019-04-14 (×3): 10 meq via INTRAVENOUS

## 2019-04-14 MED ORDER — MIDAZOLAM HCL 2 MG/2ML IJ SOLN
INTRAMUSCULAR | Status: AC
  Filled 2019-04-14: qty 2

## 2019-04-14 MED ORDER — LEVALBUTEROL HCL 0.63 MG/3ML IN NEBU
0.6300 mg | INHALATION_SOLUTION | Freq: Four times a day (QID) | RESPIRATORY_TRACT | Status: DC
  Administered 2019-04-14 – 2019-04-15 (×3): 0.63 mg via RESPIRATORY_TRACT
  Filled 2019-04-14 (×3): qty 3

## 2019-04-14 MED ORDER — TRANEXAMIC ACID-NACL 1000-0.7 MG/100ML-% IV SOLN
1000.0000 mg | INTRAVENOUS | Status: DC
  Filled 2019-04-14: qty 100

## 2019-04-14 MED ORDER — INSULIN REGULAR(HUMAN) IN NACL 100-0.9 UT/100ML-% IV SOLN: INTRAVENOUS | Status: DC

## 2019-04-14 MED ORDER — PROPOFOL 10 MG/ML IV BOLUS
INTRAVENOUS | Status: AC
  Filled 2019-04-14: qty 20

## 2019-04-14 MED ORDER — 0.9 % SODIUM CHLORIDE (POUR BTL) OPTIME
TOPICAL | Status: DC | PRN
  Administered 2019-04-14: 5000 mL

## 2019-04-14 MED ORDER — PROTAMINE SULFATE 10 MG/ML IV SOLN
INTRAVENOUS | Status: AC
  Filled 2019-04-14: qty 50

## 2019-04-14 MED ORDER — BISACODYL 5 MG PO TBEC
10.0000 mg | DELAYED_RELEASE_TABLET | Freq: Every day | ORAL | Status: DC
  Administered 2019-04-15 – 2019-04-16 (×2): 10 mg via ORAL
  Filled 2019-04-14 (×2): qty 2

## 2019-04-14 MED ORDER — FENTANYL CITRATE (PF) 250 MCG/5ML IJ SOLN
INTRAMUSCULAR | Status: AC
  Filled 2019-04-14: qty 20

## 2019-04-14 MED ORDER — SODIUM CHLORIDE 0.9% IV SOLUTION: Freq: Once | INTRAVENOUS | Status: DC

## 2019-04-14 MED ORDER — ONDANSETRON HCL 4 MG/2ML IJ SOLN
INTRAMUSCULAR | Status: AC
  Filled 2019-04-14: qty 2

## 2019-04-14 MED ORDER — LACTATED RINGERS IV SOLN
INTRAVENOUS | Status: DC
  Administered 2019-04-14: 09:00:00 via INTRAVENOUS

## 2019-04-14 MED ORDER — FENTANYL CITRATE (PF) 250 MCG/5ML IJ SOLN
INTRAMUSCULAR | Status: DC | PRN
  Administered 2019-04-14: 50 ug via INTRAVENOUS
  Administered 2019-04-14: 150 ug via INTRAVENOUS
  Administered 2019-04-14: 100 ug via INTRAVENOUS
  Administered 2019-04-14: 50 ug via INTRAVENOUS
  Administered 2019-04-14: 100 ug via INTRAVENOUS
  Administered 2019-04-14: 200 ug via INTRAVENOUS
  Administered 2019-04-14: 50 ug via INTRAVENOUS
  Administered 2019-04-14 (×2): 100 ug via INTRAVENOUS
  Administered 2019-04-14 (×4): 150 ug via INTRAVENOUS

## 2019-04-14 MED ORDER — ACETAMINOPHEN 160 MG/5ML PO SOLN: 1000.0000 mg | Freq: Four times a day (QID) | ORAL | Status: DC

## 2019-04-14 MED ORDER — ASPIRIN EC 325 MG PO TBEC
325.0000 mg | DELAYED_RELEASE_TABLET | Freq: Every day | ORAL | Status: DC
  Administered 2019-04-15 – 2019-04-16 (×2): 325 mg via ORAL
  Filled 2019-04-14 (×2): qty 1

## 2019-04-14 SURGICAL SUPPLY — 74 items
ADH SKN CLS APL DERMABOND .7 (GAUZE/BANDAGES/DRESSINGS) ×8
BAG DECANTER FOR FLEXI CONT (MISCELLANEOUS) ×5 IMPLANT
BATTERY MAXDRIVER (MISCELLANEOUS) ×2 IMPLANT
BLADE CLIPPER SURG (BLADE) ×5 IMPLANT
BLADE STERNUM SYSTEM 6 (BLADE) ×5 IMPLANT
BNDG ELASTIC 4X5.8 VLCR STR LF (GAUZE/BANDAGES/DRESSINGS) ×5 IMPLANT
BNDG ELASTIC 6X5.8 VLCR STR LF (GAUZE/BANDAGES/DRESSINGS) ×5 IMPLANT
BNDG GAUZE ELAST 4 BULKY (GAUZE/BANDAGES/DRESSINGS) ×5 IMPLANT
CANISTER SUCT 3000ML PPV (MISCELLANEOUS) ×5 IMPLANT
CATH CPB KIT GERHARDT (MISCELLANEOUS) ×5 IMPLANT
CATH THORACIC 28FR (CATHETERS) ×5 IMPLANT
COVER WAND RF STERILE (DRAPES) ×5 IMPLANT
DERMABOND ADVANCED (GAUZE/BANDAGES/DRESSINGS) ×2
DERMABOND ADVANCED .7 DNX12 (GAUZE/BANDAGES/DRESSINGS) ×2 IMPLANT
DRAIN CHANNEL 28F RND 3/8 FF (WOUND CARE) ×5 IMPLANT
DRAPE CARDIOVASCULAR INCISE (DRAPES) ×5
DRAPE INCISE IOBAN 66X45 STRL (DRAPES) ×2 IMPLANT
DRAPE SLUSH/WARMER DISC (DRAPES) ×5 IMPLANT
DRAPE SRG 135X102X78XABS (DRAPES) ×4 IMPLANT
DRESSING PREVENA PLUS CUSTOM (GAUZE/BANDAGES/DRESSINGS) ×2 IMPLANT
DRSG AQUACEL AG ADV 3.5X14 (GAUZE/BANDAGES/DRESSINGS) ×5 IMPLANT
DRSG PREVENA PLUS CUSTOM (GAUZE/BANDAGES/DRESSINGS) ×5
ELECT BLADE 4.0 EZ CLEAN MEGAD (MISCELLANEOUS) ×5
ELECT REM PT RETURN 9FT ADLT (ELECTROSURGICAL) ×10
ELECTRODE BLDE 4.0 EZ CLN MEGD (MISCELLANEOUS) ×4 IMPLANT
ELECTRODE REM PT RTRN 9FT ADLT (ELECTROSURGICAL) ×8 IMPLANT
FELT TEFLON 1X6 (MISCELLANEOUS) ×10 IMPLANT
GAUZE SPONGE 4X4 12PLY STRL (GAUZE/BANDAGES/DRESSINGS) ×10 IMPLANT
GLOVE BIO SURGEON STRL SZ 6.5 (GLOVE) ×15 IMPLANT
GLOVE BIOGEL PI IND STRL 6.5 (GLOVE) ×1 IMPLANT
GLOVE BIOGEL PI INDICATOR 6.5 (GLOVE) ×1
GOWN STRL REUS W/ TWL LRG LVL3 (GOWN DISPOSABLE) ×16 IMPLANT
GOWN STRL REUS W/TWL LRG LVL3 (GOWN DISPOSABLE) ×20
HEMOSTAT POWDER SURGIFOAM 1G (HEMOSTASIS) ×15 IMPLANT
HEMOSTAT SURGICEL 2X14 (HEMOSTASIS) ×5 IMPLANT
KIT BASIN OR (CUSTOM PROCEDURE TRAY) ×5 IMPLANT
KIT CATH SUCT 8FR (CATHETERS) ×5 IMPLANT
KIT PREVENA INCISION MGT20CM45 (CANNISTER) ×1 IMPLANT
KIT SUCTION CATH 14FR (SUCTIONS) ×10 IMPLANT
KIT TURNOVER KIT B (KITS) ×5 IMPLANT
KIT VASOVIEW HEMOPRO 2 VH 4000 (KITS) ×5 IMPLANT
LEAD PACING MYOCARDI (MISCELLANEOUS) ×7 IMPLANT
MARKER GRAFT CORONARY BYPASS (MISCELLANEOUS) ×15 IMPLANT
NS IRRIG 1000ML POUR BTL (IV SOLUTION) ×25 IMPLANT
PACK E OPEN HEART (SUTURE) ×5 IMPLANT
PACK OPEN HEART (CUSTOM PROCEDURE TRAY) ×5 IMPLANT
PAD ARMBOARD 7.5X6 YLW CONV (MISCELLANEOUS) ×10 IMPLANT
PAD ELECT DEFIB RADIOL ZOLL (MISCELLANEOUS) ×5 IMPLANT
PENCIL BUTTON HOLSTER BLD 10FT (ELECTRODE) ×5 IMPLANT
PLATE STERNAL 2.3X208 14H 2-PK (Plate) ×2 IMPLANT
POSITIONER HEAD DONUT 9IN (MISCELLANEOUS) ×5 IMPLANT
PUNCH AORTIC ROTATE  4.5MM 8IN (MISCELLANEOUS) ×2 IMPLANT
SCREW STERNAL LOCK 2.3MM (Screw) ×22 IMPLANT
SET CARDIOPLEGIA MPS 5001102 (MISCELLANEOUS) ×1 IMPLANT
SPONGE LAP 18X18 X RAY DECT (DISPOSABLE) ×2 IMPLANT
SUT BONE WAX W31G (SUTURE) ×5 IMPLANT
SUT PROLENE 3 0 SH1 36 (SUTURE) ×8 IMPLANT
SUT PROLENE 4 0 TF (SUTURE) ×10 IMPLANT
SUT PROLENE 6 0 CC (SUTURE) ×18 IMPLANT
SUT PROLENE 7 0 BV 1 (SUTURE) ×2 IMPLANT
SUT PROLENE 7 0 BV1 MDA (SUTURE) ×5 IMPLANT
SUT PROLENE 8 0 BV175 6 (SUTURE) ×9 IMPLANT
SUT SILK 2 0 SH CR/8 (SUTURE) ×4 IMPLANT
SUT STEEL 6MS V (SUTURE) ×5 IMPLANT
SUT STEEL SZ 6 DBL 3X14 BALL (SUTURE) ×5 IMPLANT
SUT VIC AB 1 CTX 18 (SUTURE) ×10 IMPLANT
SYSTEM SAHARA CHEST DRAIN ATS (WOUND CARE) ×5 IMPLANT
TOWEL GREEN STERILE (TOWEL DISPOSABLE) ×5 IMPLANT
TOWEL GREEN STERILE FF (TOWEL DISPOSABLE) ×5 IMPLANT
TRAY FOLEY SLVR 16FR TEMP STAT (SET/KITS/TRAYS/PACK) ×5 IMPLANT
TUBE CONNECTING 20X1/4 (TUBING) ×4 IMPLANT
TUBING LAP HI FLOW INSUFFLATIO (TUBING) ×5 IMPLANT
UNDERPAD 30X30 (UNDERPADS AND DIAPERS) ×5 IMPLANT
WATER STERILE IRR 1000ML POUR (IV SOLUTION) ×10 IMPLANT

## 2019-04-14 NOTE — Progress Notes (Signed)
Failed attempts at pre op ABG X2 .  RN aware, dayshift RT to evaluate and reattempt.

## 2019-04-14 NOTE — Brief Op Note (Signed)
      [address]       [place] [address]             [phone]       04/14/2019  5:41 PM  PATIENT:  Greig Right  58 y.o. female  PRE-OPERATIVE DIAGNOSIS:  CAD  POST-OPERATIVE DIAGNOSIS:  Same   PROCEDURE:  Procedure(s) with comments: CORONARY ARTERY BYPASS GRAFTING (CABG)x4. Left intermal mammary take down and endovein harvest right greater saphenous vein. LIMA to LAD. Vein graft to RPD, OM with sequential to distal circ. (N/A) TRANSESOPHAGEAL ECHOCARDIOGRAM (TEE) (N/A) Application Of Wound Vac - Incisional wound vac.  VAC #[id number] Sternal Closure using KLS Sternal Plating Set. (Left) SURGEON:  Surgeon(s) and Role:    * Grace Isaac, MD - Primary  PHYSICIAN ASSISTANT: WAYNE GOLD PA-C  ANESTHESIA:   general  EBL:  1650 mL   BLOOD ADMINISTERED:none  DRAINS: ROUTINE PLEURAL AND PERICARDIAL CHEST TUBES   LOCAL MEDICATIONS USED:  NONE  SPECIMEN:  No Specimen  DISPOSITION OF SPECIMEN:  N/A  COUNTS:  YES   DICTATION: .Other Dictation: Dictation Number PENDING  PLAN OF CARE: Admit to inpatient   PATIENT DISPOSITION:  ICU - intubated and hemodynamically stable.   Delay start of Pharmacological VTE agent (>24hrs) due to surgical blood loss or risk of bleeding: yes  COMPLICATIONS:NO KNOWN

## 2019-04-14 NOTE — Progress Notes (Signed)
RT Note: Received call from lab staff regarding ABG. Stated sample was "too old". Sample was drawn at 5:25 and sent to main lab (12) at 5:28-5:30.  Chare RT to redraw sample.

## 2019-04-14 NOTE — Procedures (Signed)
Extubation Procedure Note  Patient Details:   Name: Michele [name] DOB: [birth year] MRN: [id number]   Airway Documentation:    Vent end date: (not recorded) Vent end time: (not recorded)   Evaluation  O2 sats: stable throughout Complications: No apparent complications Patient did tolerate procedure well. Bilateral Breath Sounds: Clear, Diminished   Yes   Patient had positive cuff leak. Patient was extubated to 4L [place] with humidity. Tolerated well. No stridor noted.  VC: 400 NIF: -39  Rylend Pietrzak A Dawsyn Zurn 04/14/2019, 11:24 PM

## 2019-04-14 NOTE — Progress Notes (Addendum)
Progress Note  Patient Name: Michele [name] Date of Encounter: 04/14/2019  Primary Cardiologist: Verne Carrow, MD   Subjective   No chest pain. Heading down for surgery.   Inpatient Medications    Scheduled Meds:  aspirin  81 mg Oral Daily   atorvastatin  80 mg Oral q1800   carvedilol  3.125 mg Oral BID WC   epinephrine  0-10 mcg/min Intravenous To OR   heparin-papaverine-plasmalyte irrigation   Irrigation To OR   insulin aspart  0-15 Units Subcutaneous TID WC   insulin aspart  0-5 Units Subcutaneous QHS   insulin   Intravenous To OR   magnesium sulfate  40 mEq Other To OR   mupirocin ointment  1 application Nasal BID   phenylephrine  30-200 mcg/min Intravenous To OR   potassium chloride  80 mEq Other To OR   sodium chloride flush  3 mL Intravenous Q12H   tranexamic acid  15 mg/kg Intravenous To OR   tranexamic acid  2 mg/kg Intracatheter To OR   Continuous Infusions:  sodium chloride     sodium chloride 150 mL/hr at 04/13/19 1225   sodium chloride     cefUROXime (ZINACEF)  IV     cefUROXime (ZINACEF)  IV     dexmedetomidine     heparin 30,000 units/NS 1000 mL solution for CELLSAVER     heparin 1,400 Units/hr (04/14/19 0737)   milrinone     nitroGLYCERIN 30 mcg/min (04/14/19 0500)   nitroGLYCERIN     norepinephrine     tranexamic acid (CYKLOKAPRON) infusion ([facility])     vancomycin     PRN Meds: sodium chloride, acetaminophen, diazepam, morphine injection, ondansetron (ZOFRAN) IV, pneumococcal 23 valent vaccine, sodium chloride flush, temazepam   Vital Signs    Vitals:   04/13/19 1825 04/13/19 2000 04/13/19 2117 04/14/19 0509  BP: 133/64  122/64 (!) 163/85  Pulse:    92  Resp: (!) Temp:   98.5 F (36.9 C) 97.9 F (36.6 C)  TempSrc:   Oral Oral  SpO2:   94% 96%  Weight:    128 kg  Height:        Intake/Output Summary (Last 24 hours) at 04/14/2019 0757 Last data filed at 04/14/2019 0500 Gross per 24 hour  Intake 193.33 ml    Output --  Net 193.33 ml   Last 3 Weights 04/14/2019 04/12/2019 04/12/2019  Weight (lbs) 282 lb 3 oz 281 lb 8.4 oz 260 lb  Weight (kg) 128 kg 127.7 kg 117.935 kg      Telemetry    SR, short run of SVT - Personally Reviewed  ECG    No new tracing this morning.  Physical Exam  Pleasant WF, sitting up in bed GEN: No acute distress.   Neck: No JVD Cardiac: RRR, no murmurs, rubs, or gallops.  Respiratory: Clear to auscultation bilaterally. GI: Soft, nontender, non-distended  MS: No edema; No deformity. Neuro:  Nonfocal  Psych: Normal affect   Labs    High Sensitivity Troponin:   Recent Labs  Lab 04/12/19 0917 04/12/19 1145  TROPONINIHS 260* 449*      Chemistry Recent Labs  Lab 04/12/19 0917 04/13/19 0733 04/13/19 1809 04/14/19 0456  NA 134* 139 137 137  K 3.4* 3.6 3.6 3.4*  CL 101 102 102 102  CO2 GLUCOSE 204* 160* 177* 162*  BUN CREATININE 1.11* 1.09* 0.99 0.99  CALCIUM 9.9 8.7*  8.4* 8.4*  PROT 7.7  --  6.6  --   ALBUMIN 3.8  --  2.9*  --   AST 26  --  19  --   ALT 29  --  26  --   ALKPHOS 93  --  75  --   BILITOT 0.5  --  0.7  --   GFRNONAA 55* 56* >60 >60  GFRAA >60 >60 >60 >60  ANIONGAP 10 9 10 10      Hematology Recent Labs  Lab 04/12/19 0917 04/13/19 0434 04/14/19 0456  WBC 7.8 8.1 9.1  RBC 5.07 4.55 4.21  HGB 14.8 13.2 12.0  HCT 45.0 40.5 37.4  MCV 88.8 89.0 88.8  MCH 29.2 29.0 28.5  MCHC 32.9 32.6 32.1  RDW 14.4 14.6 14.6  PLT 245 235 212    BNPNo results for input(s): BNP, PROBNP in the last 168 hours.   DDimer No results for input(s): DDIMER in the last 168 hours.   Radiology    CARDIAC CATHETERIZATION  Result Date: 04/13/2019  Mid RCA to Dist RCA lesion is 100% stenosed.  1st Diag-1 lesion is 99% stenosed.  1st Diag-2 lesion is 95% stenosed.  2nd Mrg lesion is 80% stenosed.  Mid Cx lesion is 95% stenosed.  Prox Cx lesion is 70% stenosed.  Prox LAD lesion is 90% stenosed.  The left  ventricular systolic function is normal.  LV end diastolic pressure is normal.  3rd Mrg lesion is 70% stenosed.  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  There is very faint collateralization from the proximal SA branch as well as from the distal circumflex supplying distal branches of the RCA. Preserved global LV contractility with EF estimated 60 to 65% without significant focal segmental wall motion abnormality.  LVEDP 10 to 12 mmHg. RECOMMENDATION: Surgical consultation for CABG revascularization.  The patient is currently on IV nitroglycerin.  Will resume heparin 10 hours post sheath removal.  Aggressive lipid-lowering therapy good LDL less than 70. Optimal blood pressure control.  Smoking cessation and weight loss are essential.    DG Chest Port 1 View  Result Date: 04/12/2019 CLINICAL DATA:  Hypertension.  Acute coronary syndrome EXAM: PORTABLE CHEST 1 VIEW COMPARISON:  None FINDINGS: Artifact from EKG leads. Normal heart size and mediastinal contours. Mild interstitial coarsening. There is no edema, consolidation, effusion, or pneumothorax. IMPRESSION: No evidence of acute disease. Electronically Signed   By: Monte Fantasia M.D.   On: 04/12/2019 09:42   VAS [place] LOWER EXTREMITY SAPHENOUS VEIN MAPPING  Result Date: 04/13/2019 LOWER EXTREMITY VEIN MAPPING Indications:  Pre-CABG Risk Factors: Hypertension.  Limitations: Patient body habitus, patient positioning Comparison Study: No prior studies. Performing Technologist: Oliver Hum RVT  Examination Guidelines: A complete evaluation includes B-mode imaging, spectral  Doppler, color Doppler, and power Doppler as needed of all accessible portions of each vessel. Bilateral testing is considered an integral part of a complete examination. Limited examinations for reoccurring indications may be performed as noted. +---------------+-----------+----------------------+---------------+-----------+   RT Diameter  RT Findings         GSV  LT Diameter  LT Findings      (cm)                                            (cm)                  +---------------+-----------+----------------------+---------------+-----------+      0.81                     Saphenofemoral         1.02                                                   Junction                                  +---------------+-----------+----------------------+---------------+-----------+      0.49                     Proximal thigh         0.40                  +---------------+-----------+----------------------+---------------+-----------+      0.37       branching       Mid thigh            0.51       branching  +---------------+-----------+----------------------+---------------+-----------+      0.22                      Distal thigh          0.58                  +---------------+-----------+----------------------+---------------+-----------+      0.14                          Knee              0.53       branching  +---------------+-----------+----------------------+---------------+-----------+      0.41       branching       Prox calf            0.48       branching  +---------------+-----------+----------------------+---------------+-----------+      0.31                        Mid calf            0.33       branching  +---------------+-----------+----------------------+---------------+-----------+      0.33                      Distal calf           0.33                  +---------------+-----------+----------------------+---------------+-----------+  Diagnosing physician: Sherald Hess MD Electronically signed by Sherald Hess MD on 04/13/2019 at 6:38:59 PM.    Final    ECHOCARDIOGRAM COMPLETE  Result Date: 04/13/2019   ECHOCARDIOGRAM REPORT   Patient Name:   Michele [name] Caughlin Date of  Exam: 04/13/2019 Medical Rec #:  [id number]          Height:       64.0 in Accession #:    [id number]         Weight:       281.5 lb Date of Birth:  [birth year]          BSA:          2.26 m Patient Age:    58 years           BP:           146/78 mmHg Patient Gender: F                  HR:           104 bpm. Exam Location:  Inpatient Procedure: 2D Echo, Cardiac Doppler, Color Doppler and Intracardiac            Opacification Agent Indications:    Z01.818 Encounter for other preprocedural examination; CAD of                 Native Vessel [id number]  History:        Patient has no prior history of Echocardiogram examinations.  Sonographer:    Elmarie Shiley Dance Referring Phys: [id number] EDWARD B GERHARDT IMPRESSIONS  1. Left ventricular ejection fraction, by visual estimation, is 70 to 75%. The left ventricle has hyperdynamic function. There is mildly increased left ventricular hypertrophy.  2. Left ventricular diastolic parameters are consistent with Grade I diastolic dysfunction (impaired relaxation).  3. The left ventricle has no regional wall motion abnormalities.  4. Global right ventricle has normal systolic function.The right ventricular size is normal.  5. Left atrial size was normal.  6. Right atrial size was normal.  7. The mitral valve is normal in structure. Trivial mitral valve regurgitation. No evidence of mitral stenosis.  8. The tricuspid valve is normal in structure. Tricuspid valve regurgitation is trivial.  9. The aortic valve has an indeterminant number of cusps. Aortic valve regurgitation is not visualized. 10. The pulmonic valve was not well visualized. Pulmonic valve regurgitation is not visualized. 11. Technically difficult; definity used; vigorous LV systolic  function; mild LVH; grade 1 diastolic dysfunction; aortic valve not well visualized; elevated gradient across aortic valve may partially be explained by vigorous LV functon; cannot R/O mild AS; suggest TEE to assess valve morphology if clinically indicated. FINDINGS  Left Ventricle: Left ventricular ejection fraction, by visual estimation, is 70 to 75%. The left ventricle has hyperdynamic function. The left ventricle has no regional wall motion abnormalities. There is mildly increased left ventricular hypertrophy. Left ventricular diastolic parameters are consistent with Grade I diastolic dysfunction (impaired relaxation). Normal left atrial pressure. Right Ventricle: The right ventricular size is normal. No increase in right ventricular wall thickness. Global RV systolic function is has normal systolic function. Left Atrium: Left atrial size was normal in size. Right Atrium: Right atrial size was normal in size Pericardium: There is no evidence of pericardial effusion. Mitral Valve: The mitral valve is normal in structure. Trivial mitral valve regurgitation. No evidence of mitral valve stenosis by observation. Tricuspid Valve: The tricuspid valve is normal in structure. Tricuspid valve regurgitation is trivial. Aortic Valve: The aortic valve has an indeterminant number of cusps. Aortic valve regurgitation is not visualized. Aortic valve mean gradient measures 17.5 mmHg. Aortic valve peak gradient measures 29.9 mmHg. Aortic valve area, by VTI measures 1.31 cm. Pulmonic Valve: The pulmonic valve was not  well visualized. Pulmonic valve regurgitation is not visualized. Pulmonic regurgitation is not visualized. Aorta: The aortic root is normal in size and structure. Venous: The inferior vena cava was not well visualized.  Additional Comments: Technically difficult; definity used; vigorous LV systolic function; mild LVH; grade 1 diastolic dysfunction; aortic valve not well visualized; elevated gradient across aortic  valve may partially be explained by vigorous LV functon; cannot R/O mild AS; suggest TEE to assess valve morphology if clinically indicated.  LEFT VENTRICLE PLAX 2D LVIDd:         4.60 cm LVIDs:         3.50 cm LV PW:         1.80 cm LV IVS:        1.10 cm LVOT diam:     1.90 cm LV SV:         46 ml LV SV Index:   18.75 LVOT Area:     2.84 cm  RIGHT VENTRICLE RV Basal diam:  2.60 cm RV S prime:     18.50 cm/s TAPSE (M-mode): 2.3 cm LEFT ATRIUM             Index       RIGHT ATRIUM           Index LA diam:        3.20 cm 1.41 cm/m  RA Area:     13.60 cm LA Vol (A2C):   75.9 ml 33.55 ml/m RA Volume:   33.70 ml  14.90 ml/m LA Vol (A4C):   40.9 ml 18.08 ml/m LA Biplane Vol: 55.7 ml 24.62 ml/m  AORTIC VALVE AV Area (Vmax):    1.31 cm AV Area (Vmean):   1.14 cm AV Area (VTI):     1.31 cm AV Vmax:           273.50 cm/s AV Vmean:          195.000 cm/s AV VTI:            0.494 m AV Peak Grad:      29.9 mmHg AV Mean Grad:      17.5 mmHg LVOT Vmax:         126.50 cm/s LVOT Vmean:        78.550 cm/s LVOT VTI:          0.228 m LVOT/AV VTI ratio: 0.46  AORTA Ao Root diam: 3.30 cm Ao Asc diam:  2.50 cm MITRAL VALVE MV Area (PHT): 3.72 cm              SHUNTS MV PHT:        59.16 msec            Systemic VTI:  0.23 m MV Decel Time: 204 msec              Systemic Diam: 1.90 cm MV E velocity: 102.00 cm/s 103 cm/s MV A velocity: 106.00 cm/s 70.3 cm/s MV E/A ratio:  0.96        1.5  Olga Millers MD Electronically signed by Olga Millers MD Signature Date/Time: 04/13/2019/5:36:31 PM    Final    VAS US DOPPLER PRE CABG  Result Date: 04/13/2019 PREOPERATIVE VASCULAR EVALUATION  Indications:      Pre-CABG. Risk Factors:     Hypertension. Limitations:      Patient body habitus, respiratory disturbance, right TR band Comparison Study: No prior studies. Performing Technologist: Olen Cordial Rvt  Examination Guidelines: A complete evaluation includes B-mode imaging, spectral Doppler, color  Doppler, and power Doppler as needed  of all accessible portions of each vessel. Bilateral testing is considered an integral part of a complete examination. Limited examinations for reoccurring indications may be performed as noted.  Right Carotid Findings: +----------+--------+-------+--------+--------------------------------+--------+           PSV cm/sEDV    StenosisDescribe                        Comments                   cm/s                                                    +----------+--------+-------+--------+--------------------------------+--------+ CCA Prox  90      15             smooth and heterogenous                  +----------+--------+-------+--------+--------------------------------+--------+ CCA Distal69      17             smooth and heterogenous                  +----------+--------+-------+--------+--------------------------------+--------+ ICA Prox  114     50             smooth, heterogenous and                                                  calcific                                 +----------+--------+-------+--------+--------------------------------+--------+ ICA Distal73      29                                                      +----------+--------+-------+--------+--------------------------------+--------+ ECA       150     14                                                      +----------+--------+-------+--------+--------------------------------+--------+ Portions of this table do not appear on this page. +----------+--------+-------+--------+------------+           PSV cm/sEDV cmsDescribeArm Pressure +----------+--------+-------+--------+------------+ Subclavian162                                 +----------+--------+-------+--------+------------+ +---------+--------+--+--------+--+---------+ VertebralPSV cm/s29EDV cm/s10Antegrade +---------+--------+--+--------+--+---------+ Left Carotid Findings:  +----------+--------+--------+--------+-----------------------+--------+           PSV cm/sEDV cm/sStenosisDescribe               Comments +----------+--------+--------+--------+-----------------------+--------+ CCA Prox  98      18              smooth and heterogenous         +----------+--------+--------+--------+-----------------------+--------+  CCA Distal95      18              smooth and heterogenous         +----------+--------+--------+--------+-----------------------+--------+ ICA Prox  100     35              smooth and heterogenous         +----------+--------+--------+--------+-----------------------+--------+ ICA Distal92      31                                              +----------+--------+--------+--------+-----------------------+--------+ ECA       161     12                                              +----------+--------+--------+--------+-----------------------+--------+ +----------+--------+--------+--------+------------+ SubclavianPSV cm/sEDV cm/sDescribeArm Pressure +----------+--------+--------+--------+------------+           138                     173          +----------+--------+--------+--------+------------+ +---------+--------+--+--------+--+---------+ VertebralPSV cm/s41EDV cm/s12Antegrade +---------+--------+--+--------+--+---------+  ABI Findings: +--------+------------------+-----+--------+--------+ Right   Rt Pressure (mmHg)IndexWaveformComment  +--------+------------------+-----+--------+--------+ Brachial                               TR band  +--------+------------------+-----+--------+--------+ PTA     136               0.79 biphasic         +--------+------------------+-----+--------+--------+ DP      102               0.59 biphasic         +--------+------------------+-----+--------+--------+ +--------+------------------+-----+---------+-------+ Left    Lt Pressure  (mmHg)IndexWaveform Comment +--------+------------------+-----+---------+-------+ [id number]                    triphasic        +--------+------------------+-----+---------+-------+ PTA     121               0.70 biphasic         +--------+------------------+-----+---------+-------+ DP      144               0.83 biphasic         +--------+------------------+-----+---------+-------+  Right Doppler Findings: +--------+--------+-----+-------+--------+ Site    PressureIndexDopplerComments +--------+--------+-----+-------+--------+ Brachial                    TR band  +--------+--------+-----+-------+--------+  Left Doppler Findings: +--------+--------+-----+---------+--------+ Site    PressureIndexDoppler  Comments +--------+--------+-----+---------+--------+ [id number]          triphasic         +--------+--------+-----+---------+--------+ Radial               triphasic         +--------+--------+-----+---------+--------+ Ulnar                triphasic         +--------+--------+-----+---------+--------+  Summary: Right Carotid: Velocities in the right ICA are consistent with a 40-59%                stenosis. Left Carotid: Velocities in the  left ICA are consistent with a 1-39% stenosis. Vertebrals: Bilateral vertebral arteries demonstrate antegrade flow. Right ABI: Resting right ankle-brachial index indicates moderate right lower extremity arterial disease. Left ABI: Resting left ankle-brachial index indicates mild left lower extremity arterial disease. Left Upper Extremity: Doppler waveform obliterate with left radial compression. Doppler waveform obliterate with left ulnar compression.  Electronically signed by Sherald Hess MD on 04/13/2019 at 6:38:35 PM.    Final     Cardiac Studies   Cath: 04/13/19  Mid RCA to Dist RCA lesion is 100% stenosed. 1st Diag-1 lesion is 99% stenosed. 1st Diag-2 lesion is 95% stenosed. 2nd Mrg lesion is 80%  stenosed. Mid Cx lesion is 95% stenosed. Prox Cx lesion is 70% stenosed. Prox LAD lesion is 90% stenosed. The left ventricular systolic function is normal. LV end diastolic pressure is normal. 3rd Mrg lesion is 70% stenosed.   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  There is very faint collateralization from the proximal SA branch as well as from the distal circumflex supplying distal branches of the RCA.   Preserved global LV contractility with EF estimated 60 to 65% without significant focal segmental wall motion abnormality.  LVEDP 10 to 12 mmHg.   RECOMMENDATION: Surgical consultation for CABG revascularization.  The patient is currently on IV nitroglycerin.  Will resume heparin 10 hours post sheath removal.  Aggressive lipid-lowering therapy good LDL less than 70. Optimal blood pressure control.  Smoking cessation and weight loss are essential.    Diagnostic Dominance: Right  TTE: 04/13/19  IMPRESSIONS      1. Left ventricular ejection fraction, by visual estimation, is 70 to 75%. The left ventricle has hyperdynamic function. There is mildly increased left ventricular hypertrophy.  2. Left ventricular diastolic parameters are consistent with Grade I diastolic dysfunction (impaired relaxation).  3. The left ventricle has no regional wall motion abnormalities.  4. Global right ventricle has normal systolic function.The right ventricular size is normal.  5. Left atrial size was normal.  6. Right atrial size was normal.  7. The mitral valve is normal in structure. Trivial mitral valve regurgitation. No  evidence of mitral stenosis.  8. The tricuspid valve is normal in structure. Tricuspid valve regurgitation is trivial.  9. The aortic valve has an indeterminant number of cusps. Aortic valve regurgitation is not visualized. 10. The pulmonic valve was not well visualized. Pulmonic valve regurgitation is not visualized. 11. Technically difficult; definity used; vigorous LV systolic function; mild LVH; grade 1 diastolic dysfunction; aortic valve not well visualized; elevated gradient across aortic valve may partially be explained by vigorous LV functon; cannot R/O mild  AS; suggest TEE to assess valve morphology if clinically indicated.  Patient Profile     58 y.o. female no reported past medical history who presented with chest pain the midsternal point.  Found to have elevated troponin and transferred to [facility] for further work-up. 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 Planned for CABG.   Assessment & Plan    1. Chest pain/NSTEMI: had several episodes of having the sensation of something stuck in her throat. Some episodes have  been with eating, but then had an event while at work in the absence of eating that last for 2-3 hours. HsT 260>>>449. Placed on IV heparin and nitro. Underwent cardiac cath noted above with 3v disease. Seen by [facility] with plans for CABG today. EF noted at 70-75%, G1DD. -- remains on IV heparin, nitro, ASA, statin, BB   2. HTN: Blood pressures severely elevated on admission. Improved today. Will continue with coreg   3. Hypokalemia: K+ 3.4 today, replacement ordered for OR   4. Tobacco use: cessation advised.  For questions or updates, please contact [facility] Please consult [email or web address] for contact info under        Signed, Laverda Page, NP  04/14/2019, 7:57 AM    Pt is in the OR for bypass. We will follow tomorrow.   Verne Carrow 04/14/2019 9:29 AM

## 2019-04-14 NOTE — Anesthesia Procedure Notes (Signed)
Procedure Name: Intubation Date/Time: 04/14/2019 10:22 AM Performed by: Inda Coke, CRNA Pre-anesthesia Checklist: Patient identified, Emergency Drugs available, Suction available and Patient being monitored Patient Re-evaluated:Patient Re-evaluated prior to induction Oxygen Delivery Method: Circle System Utilized Preoxygenation: Pre-oxygenation with 100% oxygen Induction Type: IV induction Ventilation: Mask ventilation without difficulty and Oral airway inserted - appropriate to patient size Laryngoscope Size: Mac and 3 Grade View: Grade II Tube type: Oral Number of attempts: 1 Airway Equipment and Method: Stylet and Oral airway Placement Confirmation: ETT inserted through vocal cords under direct vision,  positive ETCO2 and breath sounds checked- equal and bilateral Secured at: 22 cm Tube secured with: Tape Dental Injury: Teeth and Oropharynx as per pre-operative assessment

## 2019-04-14 NOTE — Anesthesia Procedure Notes (Signed)
Central Venous Catheter Insertion Performed by: Audry Pili, MD, anesthesiologist Start/End12/03/2019 9:04 AM, 04/14/2019 9:15 AM Preanesthetic checklist: patient identified, IV checked, risks and benefits discussed, surgical consent, monitors and equipment checked, pre-op evaluation, timeout performed and anesthesia consent Position: Trendelenburg Lidocaine 1% used for infiltration and patient sedated Hand hygiene performed , maximum sterile barriers used  and Seldinger technique used Catheter size: 8.5 Fr Central line was placed.Sheath introducer Procedure performed using ultrasound guided technique. Ultrasound Notes:anatomy identified, needle tip was noted to be adjacent to the nerve/plexus identified, no ultrasound evidence of intravascular and/or intraneural injection and image(s) printed for medical record Attempts: 1 Following insertion, line sutured, dressing applied and Biopatch. Post procedure assessment: blood return through all ports, free fluid flow and no air  Patient tolerated the procedure well with no immediate complications.

## 2019-04-14 NOTE — Progress Notes (Signed)
Patient ID: Michele [name], female   DOB: October 04, 1960, 58 y.o.   MRN: [id number] EVENING ROUNDS NOTE :     [address]       [place], [address]             [phone]                 Day of Surgery Procedure(s) (LRB): CORONARY ARTERY BYPASS GRAFTING (CABG)x4. Left intermal mammary take down and endovein harvest right greater saphenous vein. LIMA to LAD. Vein graft to RPD, OM with sequential to distal circ. (N/A) TRANSESOPHAGEAL ECHOCARDIOGRAM (TEE) (N/A) Application Of Wound Vac Sternal Closure using KLS Sternal Plating Set. (Left)  Total Length of Stay:  LOS: 1 day  BP (!) 99/54   Pulse 89   Temp (!) 97.2 F (36.2 C)   Resp 12   Ht 5\' 4"  (1.626 m)   Wt 128 kg   SpO2 96%   BMI 48.44 kg/m   .Intake/Output      12/10 0701 - 12/11 0700 12/11 0701 - 12/12 0700   I.V. (mL/kg) 193.3 (1.5) 2100 (16.4)   Blood  950   IV Piggyback  600   Total Intake(mL/kg) 193.3 (1.5) 3650 (28.5)   Urine (mL/kg/hr)  750 (0.5)   Blood  1650   Chest Tube  10   Total Output  2410   Net +193.3 +1240          . sodium chloride 20 mL/hr at 04/14/19 1731  . [START ON 04/15/2019] sodium chloride    . sodium chloride 10 mL/hr at 04/14/19 1734  . albumin human    . cefUROXime (ZINACEF)  IV    . dexmedetomidine (PRECEDEX) IV infusion 0.7 mcg/kg/hr (04/14/19 1700)  . famotidine (PEPCID) IV    . insulin    . lactated ringers    . lactated ringers    . lactated ringers    . magnesium sulfate    . nitroGLYCERIN    . phenylephrine (NEO-SYNEPHRINE) Adult infusion    . potassium chloride 10 mEq (04/14/19 1738)  . vancomycin       Lab Results  Component Value Date   WBC 9.2 04/14/2019   HGB 11.4 (L) 04/14/2019   HGB 11.9 (L) 04/14/2019   HCT 35.3 (L) 04/14/2019   HCT 35.0 (L) 04/14/2019   PLT PENDING 04/14/2019   GLUCOSE 162 (H) 04/14/2019   CHOL 209 (H) 04/13/2019   TRIG 274 (H) 04/13/2019   HDL 30 (L) 04/13/2019   LDLCALC 124 (H) 04/13/2019   ALT 26 04/13/2019   AST 19 04/13/2019   NA 140 04/14/2019   K 3.8 04/14/2019   CL 102 04/14/2019   CREATININE 0.99 04/14/2019   BUN 10 04/14/2019   CO2 25 04/14/2019   TSH 3.269 03/02/2008   INR 1.5 (H) 04/14/2019   HGBA1C 7.8 (H) 04/13/2019   Stable in icu early postop bp stable , not bleeding Right upper lobe collapse on postop xray    Grace Isaac MD  Beeper [phone] Office [phone] 04/14/2019 5:43 PM

## 2019-04-14 NOTE — Progress Notes (Signed)
Patient interviewed in the preop area. Patient able to confirm name, DOB, procedure, NDKA, no metal in body, npo status and no pain at this time. Patient able to move over to OR table with moderate assistance.   Leatha Gilding, RN

## 2019-04-14 NOTE — Anesthesia Procedure Notes (Signed)
Arterial Line Insertion Start/End12/03/2019 9:00 AM, 04/14/2019 9:05 AM Performed by: Inda Coke, CRNA, CRNA  Preanesthetic checklist: patient identified, IV checked, site marked, risks and benefits discussed, surgical consent, monitors and equipment checked, pre-op evaluation, timeout performed and anesthesia consent Lidocaine 1% used for infiltration Left, radial was placed Catheter size: 20 G Hand hygiene performed  and maximum sterile barriers used  Allen's test indicative of satisfactory collateral circulation Attempts: 1 Procedure performed without using ultrasound guided technique. Following insertion, dressing applied and Biopatch. Post procedure assessment: normal  Patient tolerated the procedure well with no immediate complications.

## 2019-04-14 NOTE — Progress Notes (Signed)
Echocardiogram Echocardiogram Transesophageal has been performed.  Naithen Rivenburg F Elton Heid 04/14/2019, 10:36 AM 

## 2019-04-14 NOTE — Transfer of Care (Signed)
Immediate Anesthesia Transfer of Care Note  Patient: Michele [name]  Procedure(s) Performed: CORONARY ARTERY BYPASS GRAFTING (CABG)x4. Left intermal mammary take down and endovein harvest right greater saphenous vein. LIMA to LAD. Vein graft to RPD, OM with sequential to distal circ. (N/A Chest) TRANSESOPHAGEAL ECHOCARDIOGRAM (TEE) (N/A ) Application Of Wound Vac Sternal Closure using KLS Sternal Plating Set. (Left )  Patient Location: SICU  Anesthesia Type:General  Level of Consciousness: Patient remains intubated per anesthesia plan  Airway & Oxygen Therapy: Patient remains intubated per anesthesia plan and Patient placed on Ventilator (see vital sign flow sheet for setting)  Post-op Assessment: Report given to RN and Post -op Vital signs reviewed and stable  Post vital signs: Reviewed and stable  Last Vitals:  Vitals Value Taken Time  BP    Temp    Pulse    Resp    SpO2      Last Pain:  Vitals:   04/14/19 0552  TempSrc:   PainSc: 0-No pain      Patients Stated Pain Goal: 0 (87/68/11 5726)  Complications: No apparent anesthesia complications

## 2019-04-14 NOTE — Progress Notes (Signed)
ANTICOAGULATION CONSULT NOTE  Pharmacy Consult for heparin Indication: chest pain/ACS  Heparin Dosing Weight: 83.2 kg  Labs: Recent Labs    04/12/19 0917 04/12/19 1145 04/12/19 1724 04/13/19 0434 04/13/19 0733 04/13/19 1809 04/14/19 0456  HGB 14.8  --   --  13.2  --   --  12.0  HCT 45.0  --   --  40.5  --   --  37.4  PLT 245  --   --  235  --   --  212  APTT 26  --   --   --   --  27  --   LABPROT 13.0  --   --   --   --  13.9  --   INR 1.0  --   --   --   --  1.1  --   HEPARINUNFRC  --   --  0.48 0.13*  --   --  0.15*  CREATININE 1.11*  --   --   --  1.09* 0.99 0.99  TROPONINIHS 260* 449*  --   --   --   --   --     Estimated Creatinine Clearance: 82.1 mL/min (by C-G formula based on SCr of 0.99 mg/dL).  Assessment: 58 yr old female presented to [facility] ED, now transferred to [facility] with CP, elevated high-sensitivity troponin. Pharmacy consulted to dose heparin. Patient is not on anticoagulation PTA.   Underwent cardiac cath 12/10 finding multivessel CAD. Plan for CTVS consult for CABG revascularization eval. Heparin level this morning came back subtherapeutic at 0.15, on 1250 units/hr. Hgb 12, plt 212. No s/sx of bleeding.  Goal of Therapy:  Heparin level 0.3-0.7 units/ml Monitor platelets by anticoagulation protocol: Yes   Plan:  Will increase heparin infusion at 1400 units/hr Plan for OR today so will turn off on call to OR Monitor daily HL, CBC, and for s/sx of bleeding  Antonietta Jewel, PharmD, BCCCP Clinical Pharmacist  Phone: [phone]  Please check AMION for all [place] phone numbers After 10:00 PM, call [place] [phone] 04/14/2019 7:31 AM

## 2019-04-14 NOTE — Progress Notes (Signed)
RT NOTE: RT completed recruitment on ventilator, with RN at bedside, per MD. Patient tolerated well and is sating 100%. RT will continue to monitor.

## 2019-04-14 NOTE — Anesthesia Procedure Notes (Signed)
Central Venous Catheter Insertion Performed by: Audry Pili, MD, anesthesiologist Start/End12/03/2019 9:12 AM, 04/14/2019 9:15 AM Patient location: Pre-op. Preanesthetic checklist: patient identified, IV checked, risks and benefits discussed, surgical consent, monitors and equipment checked, pre-op evaluation, timeout performed and anesthesia consent Position: Trendelenburg Hand hygiene performed  and maximum sterile barriers used  Total catheter length 10. PA cath was placed.[place]wan type:thermodilution PA Cath depth:47 Procedure performed without using ultrasound guided technique. Attempts: 1 Patient tolerated the procedure well with no immediate complications.

## 2019-04-14 NOTE — Progress Notes (Signed)
      [address]       [place] [address]             [phone]     Pre Procedure note for inpatients:   Michele [name] has been scheduled for Procedure(s): CORONARY ARTERY BYPASS GRAFTING (CABG) (N/A) TRANSESOPHAGEAL ECHOCARDIOGRAM (TEE) (N/A) today. The various methods of treatment have been discussed with the patient. After consideration of the risks, benefits and treatment options the patient has consented to the planned procedure.   The patient has been seen and labs reviewed. There are no changes in the patient's condition to prevent proceeding with the planned procedure today.  Recent labs:  Lab Results  Component Value Date   WBC 9.1 04/14/2019   HGB 12.0 04/14/2019   HCT 37.4 04/14/2019   PLT 212 04/14/2019   GLUCOSE 162 (H) 04/14/2019   CHOL 209 (H) 04/13/2019   TRIG 274 (H) 04/13/2019   HDL 30 (L) 04/13/2019   LDLCALC 124 (H) 04/13/2019   ALT 26 04/13/2019   AST 19 04/13/2019   NA 137 04/14/2019   K 3.4 (L) 04/14/2019   CL 102 04/14/2019   CREATININE 0.99 04/14/2019   BUN 10 04/14/2019   CO2 25 04/14/2019   TSH 3.269 03/02/2008   INR 1.1 04/13/2019   HGBA1C 7.8 (H) 04/13/2019    Grace Isaac, MD 04/14/2019 9:42 AM

## 2019-04-14 NOTE — Anesthesia Postprocedure Evaluation (Signed)
Anesthesia Post Note  Patient: Michele [name]  Procedure(s) Performed: CORONARY ARTERY BYPASS GRAFTING (CABG)x4. Left intermal mammary take down and endovein harvest right greater saphenous vein. LIMA to LAD. Vein graft to RPD, OM with sequential to distal circ. (N/A Chest) TRANSESOPHAGEAL ECHOCARDIOGRAM (TEE) (N/A ) Application Of Wound Vac Sternal Closure using KLS Sternal Plating Set. (Left )     Patient location during evaluation: ICU Anesthesia Type: General Level of consciousness: sedated and patient remains intubated per anesthesia plan Pain management: pain level controlled Vital Signs Assessment: post-procedure vital signs reviewed and stable Respiratory status: patient remains intubated per anesthesia plan Cardiovascular status: stable Postop Assessment: no apparent nausea or vomiting Anesthetic complications: no    Last Vitals:  Vitals:   04/14/19 1640 04/14/19 1700  BP:  92/68  Pulse: 90 89  Resp: 12 17  Temp:  (!) (P) 36.3 C  SpO2: 93% (!) 89%    Last Pain:  Vitals:   04/14/19 1700  TempSrc: (P) Core  PainSc:                  Audry Pili

## 2019-04-15 ENCOUNTER — Inpatient Hospital Stay (HOSPITAL_COMMUNITY): Payer: Managed Care, Other (non HMO)

## 2019-04-15 DIAGNOSIS — I208 Other forms of angina pectoris: Secondary | ICD-10-CM

## 2019-04-15 LAB — CBC
HCT: 35.4 % — ABNORMAL LOW (ref 36.0–46.0)
HCT: 37.4 % (ref 36.0–46.0)
Hemoglobin: 11.5 g/dL — ABNORMAL LOW (ref 12.0–15.0)
Hemoglobin: 12 g/dL (ref 12.0–15.0)
MCH: 29.3 pg (ref 26.0–34.0)
MCH: 29.1 pg (ref 26.0–34.0)
MCHC: 32.5 g/dL (ref 30.0–36.0)
MCHC: 32.1 g/dL (ref 30.0–36.0)
MCV: 90.3 fL (ref 80.0–100.0)
MCV: 90.6 fL (ref 80.0–100.0)
Platelets: 128 10*3/uL — ABNORMAL LOW (ref 150–400)
Platelets: 134 10*3/uL — ABNORMAL LOW (ref 150–400)
RBC: 3.92 MIL/uL (ref 3.87–5.11)
RBC: 4.13 MIL/uL (ref 3.87–5.11)
RDW: 14.6 % (ref 11.5–15.5)
RDW: 14.8 % (ref 11.5–15.5)
WBC: 8.7 10*3/uL (ref 4.0–10.5)
WBC: 10 10*3/uL (ref 4.0–10.5)
nRBC: 0 % (ref 0.0–0.2)
nRBC: 0 % (ref 0.0–0.2)

## 2019-04-15 LAB — GLUCOSE, CAPILLARY
Glucose-Capillary: 119 mg/dL — ABNORMAL HIGH (ref 70–99)
Glucose-Capillary: 125 mg/dL — ABNORMAL HIGH (ref 70–99)
Glucose-Capillary: 129 mg/dL — ABNORMAL HIGH (ref 70–99)
Glucose-Capillary: 135 mg/dL — ABNORMAL HIGH (ref 70–99)
Glucose-Capillary: 146 mg/dL — ABNORMAL HIGH (ref 70–99)
Glucose-Capillary: 153 mg/dL — ABNORMAL HIGH (ref 70–99)
Glucose-Capillary: 160 mg/dL — ABNORMAL HIGH (ref 70–99)
Glucose-Capillary: 171 mg/dL — ABNORMAL HIGH (ref 70–99)
Glucose-Capillary: 176 mg/dL — ABNORMAL HIGH (ref 70–99)
Glucose-Capillary: 177 mg/dL — ABNORMAL HIGH (ref 70–99)
Glucose-Capillary: 183 mg/dL — ABNORMAL HIGH (ref 70–99)
Glucose-Capillary: 184 mg/dL — ABNORMAL HIGH (ref 70–99)
Glucose-Capillary: 219 mg/dL — ABNORMAL HIGH (ref 70–99)
Glucose-Capillary: 224 mg/dL — ABNORMAL HIGH (ref 70–99)
Glucose-Capillary: 87 mg/dL (ref 70–99)

## 2019-04-15 LAB — POCT I-STAT 7, (LYTES, BLD GAS, ICA,H+H)
Acid-base deficit: 5 mmol/L — ABNORMAL HIGH (ref 0.0–2.0)
Bicarbonate: 20.3 mmol/L (ref 20.0–28.0)
Calcium, Ion: 1.05 mmol/L — ABNORMAL LOW (ref 1.15–1.40)
HCT: 33 % — ABNORMAL LOW (ref 36.0–46.0)
Hemoglobin: 11.2 g/dL — ABNORMAL LOW (ref 12.0–15.0)
O2 Saturation: 95 %
Patient temperature: 37.6
Potassium: 3.9 mmol/L (ref 3.5–5.1)
Sodium: 139 mmol/L (ref 135–145)
TCO2: 21 mmol/L — ABNORMAL LOW (ref 22–32)
pCO2 arterial: 37.4 mmHg (ref 32.0–48.0)
pH, Arterial: 7.345 — ABNORMAL LOW (ref 7.350–7.450)
pO2, Arterial: 83 mmHg (ref 83.0–108.0)

## 2019-04-15 LAB — BASIC METABOLIC PANEL
Anion gap: 8 (ref 5–15)
Anion gap: 9 (ref 5–15)
BUN: 8 mg/dL (ref 6–20)
BUN: 12 mg/dL (ref 6–20)
CO2: 21 mmol/L — ABNORMAL LOW (ref 22–32)
CO2: 24 mmol/L (ref 22–32)
Calcium: 7.2 mg/dL — ABNORMAL LOW (ref 8.9–10.3)
Calcium: 7.6 mg/dL — ABNORMAL LOW (ref 8.9–10.3)
Chloride: 109 mmol/L (ref 98–111)
Chloride: 104 mmol/L (ref 98–111)
Creatinine, Ser: 0.97 mg/dL (ref 0.44–1.00)
Creatinine, Ser: 1.31 mg/dL — ABNORMAL HIGH (ref 0.44–1.00)
GFR calc Af Amer: >60 mL/min (ref >60)
GFR calc Af Amer: 52 mL/min — ABNORMAL LOW (ref >60)
GFR calc non Af Amer: >60 mL/min (ref >60)
GFR calc non Af Amer: 45 mL/min — ABNORMAL LOW (ref >60)
Glucose, Bld: 133 mg/dL — ABNORMAL HIGH (ref 70–99)
Glucose, Bld: 250 mg/dL — ABNORMAL HIGH (ref 70–99)
Potassium: 4 mmol/L (ref 3.5–5.1)
Potassium: 4 mmol/L (ref 3.5–5.1)
Sodium: 138 mmol/L (ref 135–145)
Sodium: 137 mmol/L (ref 135–145)

## 2019-04-15 LAB — MAGNESIUM
Magnesium: 2.4 mg/dL (ref 1.7–2.4)
Magnesium: 2.3 mg/dL (ref 1.7–2.4)

## 2019-04-15 LAB — HEMOGLOBIN AND HEMATOCRIT, BLOOD
HCT: 28.3 % — ABNORMAL LOW (ref 36.0–46.0)
Hemoglobin: 9.1 g/dL — ABNORMAL LOW (ref 12.0–15.0)

## 2019-04-15 MED ORDER — FUROSEMIDE 10 MG/ML IJ SOLN
40.0000 mg | Freq: Once | INTRAMUSCULAR | Status: AC
  Administered 2019-04-15: 40 mg via INTRAVENOUS
  Filled 2019-04-15: qty 4

## 2019-04-15 MED ORDER — SODIUM CHLORIDE 0.9% FLUSH
10.0000 mL | Freq: Two times a day (BID) | INTRAVENOUS | Status: DC
  Administered 2019-04-15 – 2019-04-16 (×3): 10 mL

## 2019-04-15 MED ORDER — LEVALBUTEROL HCL 0.63 MG/3ML IN NEBU
0.6300 mg | INHALATION_SOLUTION | Freq: Two times a day (BID) | RESPIRATORY_TRACT | Status: DC
  Administered 2019-04-15 – 2019-04-17 (×4): 0.63 mg via RESPIRATORY_TRACT
  Filled 2019-04-15 (×4): qty 3

## 2019-04-15 MED ORDER — SODIUM CHLORIDE 0.9% FLUSH: 10.0000 mL | INTRAVENOUS | Status: DC | PRN

## 2019-04-15 MED ORDER — INSULIN DETEMIR 100 UNIT/ML ~~LOC~~ SOLN
10.0000 [IU] | Freq: Once | SUBCUTANEOUS | Status: AC
  Administered 2019-04-15: 10 [IU] via SUBCUTANEOUS
  Filled 2019-04-15: qty 0.1

## 2019-04-15 MED ORDER — ENOXAPARIN SODIUM 40 MG/0.4ML ~~LOC~~ SOLN
40.0000 mg | Freq: Every day | SUBCUTANEOUS | Status: DC
  Administered 2019-04-15: 40 mg via SUBCUTANEOUS
  Filled 2019-04-15: qty 0.4

## 2019-04-15 MED ORDER — INSULIN DETEMIR 100 UNIT/ML ~~LOC~~ SOLN
10.0000 [IU] | Freq: Every day | SUBCUTANEOUS | Status: DC
  Administered 2019-04-16 – 2019-04-17 (×2): 10 [IU] via SUBCUTANEOUS
  Filled 2019-04-15 (×4): qty 0.1

## 2019-04-15 MED ORDER — INSULIN ASPART 100 UNIT/ML ~~LOC~~ SOLN
0.0000 [IU] | SUBCUTANEOUS | Status: DC
  Administered 2019-04-15: 8 [IU] via SUBCUTANEOUS
  Administered 2019-04-15: 2 [IU] via SUBCUTANEOUS
  Administered 2019-04-16 – 2019-04-17 (×8): 4 [IU] via SUBCUTANEOUS

## 2019-04-15 NOTE — Progress Notes (Signed)
Patient ID: Michele [name], female   DOB: December 28, 1960, 58 y.o.   MRN: [id number] EVENING ROUNDS NOTE :     [address]       [place] [address]             [phone]                 1 Day Post-Op Procedure(s) (LRB): CORONARY ARTERY BYPASS GRAFTING (CABG)x4. Left intermal mammary take down and endovein harvest right greater saphenous vein. LIMA to LAD. Vein graft to RPD, OM with sequential to distal circ. (N/A) TRANSESOPHAGEAL ECHOCARDIOGRAM (TEE) (N/A) Application Of Wound Vac Sternal Closure using KLS Sternal Plating Set. (Left)  Total Length of Stay:  LOS: 2 days  BP (!) 90/42   Pulse (!) 105   Temp 98.5 F (36.9 C) (Oral)   Resp 19   Ht 5\' 4"  (1.626 m)   Wt 133 kg   SpO2 94%   BMI 50.33 kg/m   .Intake/Output      12/11 0701 - 12/12 0700 12/12 0701 - 12/13 0700   P.O. 300    I.V. (mL/kg) 2964.9 (22.3) 101.3 (0.8)   Blood 950    Other  0   IV Piggyback 1252.7 27.2   Total Intake(mL/kg) 5467.5 (41.1) 128.4 (1)   Urine (mL/kg/hr) 1270 (0.4) 60 (0)   Blood 1650    Chest Tube 314 50   Total Output 3234 110   Net +2233.5 +18.4          . sodium chloride Stopped (04/15/19 0757)  . sodium chloride    . sodium chloride 10 mL/hr at 04/14/19 1734  . cefUROXime (ZINACEF)  IV Stopped (04/15/19 5701)  . dexmedetomidine (PRECEDEX) IV infusion Stopped (04/15/19 0518)  . insulin 2.8 mL/hr at 04/15/19 0900  . lactated ringers    . lactated ringers    . lactated ringers 20 mL/hr at 04/15/19 0900  . nitroGLYCERIN Stopped (04/14/19 1711)  . phenylephrine (NEO-SYNEPHRINE) Adult infusion Stopped (04/15/19 0607)     Lab Results  Component Value Date   WBC 8.7 04/15/2019   HGB 11.5 (L) 04/15/2019   HCT 35.4 (L) 04/15/2019   PLT 128 (L) 04/15/2019   GLUCOSE 133 (H) 04/15/2019   CHOL 209 (H) 04/13/2019   TRIG 274 (H) 04/13/2019   HDL 30 (L) 04/13/2019   LDLCALC 124 (H) 04/13/2019   ALT 26 04/13/2019   AST 19 04/13/2019   NA 138 04/15/2019   K 4.0  04/15/2019   CL 109 04/15/2019   CREATININE 0.97 04/15/2019   BUN 8 04/15/2019   CO2 21 (L) 04/15/2019   TSH 3.269 03/02/2008   INR 1.5 (H) 04/14/2019   HGBA1C 7.8 (H) 04/13/2019   Stable day S/g out  Good response to lasix    Grace Isaac MD  Beeper [phone] Office [phone] 04/15/2019 6:28 PM

## 2019-04-15 NOTE — Progress Notes (Signed)
Patient ID: Michele [name], female   DOB: 1960/05/29, 58 y.o.   MRN: [id number] TCTS DAILY ICU PROGRESS NOTE                   [address]            [place] [address]          [phone]   1 Day Post-Op Procedure(s) (LRB): CORONARY ARTERY BYPASS GRAFTING (CABG)x4. Left intermal mammary take down and endovein harvest right greater saphenous vein. LIMA to LAD. Vein graft to RPD, OM with sequential to distal circ. (N/A) TRANSESOPHAGEAL ECHOCARDIOGRAM (TEE) (N/A) Application Of Wound Vac Sternal Closure using KLS Sternal Plating Set. (Left)  Total Length of Stay:  LOS: 2 days   Subjective: Patient awake extubated last night neuro intact  Objective: Vital signs in last 24 hours: Temp:  [97.2 F (36.2 C)-100 F (37.8 C)] 99.5 F (37.5 C) (12/12 0800) Pulse Rate:  [88-106] 106 (12/12 0800) Cardiac Rhythm: Atrial paced (12/11 2000) Resp:  [12-25] 22 (12/12 0800) BP: (79-110)/(46-69) 104/60 (12/12 0800) SpO2:  [89 %-100 %] 93 % (12/12 0800) Arterial Line BP: (101-151)/(54-81) 148/70 (12/12 0800) FiO2 (%):  [40 %-70 %] 40 % (12/11 2217) Weight:  [485 kg] 133 kg (12/12 0500)  Filed Weights   04/14/19 0509 04/14/19 0812 04/15/19 0500  Weight: 128 kg 128 kg 133 kg    Weight change: 0 kg   Hemodynamic parameters for last 24 hours: PAP: (23-59)/(8-26) 31/26 CVP:  [7 mmHg-17 mmHg] 9 mmHg CO:  [3.2 L/min-6.4 L/min] 6.4 L/min CI:  [1.4 L/min/m2-2.8 L/min/m2] 2.8 L/min/m2  Intake/Output from previous day: 12/11 0701 - 12/12 0700 In: 5467.5 [P.O.:300; I.V.:2964.9; Blood:950; IV Piggyback:1252.7] Out: 3234 [Urine:1270; Blood:1650; Chest Tube:314]  Intake/Output this shift: Total I/O In: -  Out: 110 [Urine:60; Chest Tube:50]  Current Meds: Scheduled Meds: . acetaminophen  1,000 mg Oral Q6H   Or  . acetaminophen (TYLENOL) oral liquid 160 mg/5 mL  1,000 mg Per Tube Q6H  . aspirin EC  325 mg Oral Daily   Or  . aspirin  324 mg Per Tube Daily  .  atorvastatin  80 mg Oral q1800  . bisacodyl  10 mg Oral Daily   Or  . bisacodyl  10 mg Rectal Daily  . Chlorhexidine Gluconate Cloth  6 each Topical Daily  . docusate sodium  200 mg Oral Daily  . levalbuterol  0.63 mg Nebulization Q6H  . metoprolol tartrate  12.5 mg Oral BID   Or  . metoprolol tartrate  12.5 mg Per Tube BID  . mupirocin ointment  1 application Nasal BID  . [START ON 04/16/2019] pantoprazole  40 mg Oral Daily  . sodium chloride flush  10-40 mL Intracatheter Q12H  . sodium chloride flush  3 mL Intravenous Q12H   Continuous Infusions: . sodium chloride Stopped (04/15/19 0538)  . sodium chloride    . sodium chloride 10 mL/hr at 04/14/19 1734  . albumin human 12.5 g (04/14/19 2202)  . cefUROXime (ZINACEF)  IV 200 mL/hr at 04/15/19 0600  . dexmedetomidine (PRECEDEX) IV infusion Stopped (04/15/19 0518)  . insulin 0.8 mL/hr at 04/15/19 0600  . lactated ringers    . lactated ringers    . lactated ringers 20 mL/hr at 04/15/19 0600  . nitroGLYCERIN Stopped (04/14/19 1711)  . phenylephrine (NEO-SYNEPHRINE) Adult infusion 5 mcg/min (04/15/19 0600)   PRN Meds:.sodium chloride, albumin human, dextrose, lactated ringers, metoprolol tartrate, midazolam, morphine injection, ondansetron (ZOFRAN) IV,  oxyCODONE, pneumococcal 23 valent vaccine, sodium chloride flush, sodium chloride flush, traMADol  General appearance: alert, cooperative and no distress Neurologic: intact Heart: regular rate and rhythm, S1, S2 normal, no murmur, click, rub or gallop Lungs: diminished breath sounds bibasilar Abdomen: soft, non-tender; bowel sounds normal; no masses,  no organomegaly Extremities: extremities normal, atraumatic, no cyanosis or edema and Homans sign is negative, no sign of DVT Wound: Wound VAC incisional working properly Sternum stable Lab Results: CBC: Recent Labs    04/14/19 2300 04/15/19 0323 04/15/19 0420  WBC 9.0  --  8.7  HGB 11.7* 11.2* 11.5*  HCT 36.7 33.0* 35.4*  PLT  140*  --  128*   BMET:  Recent Labs    04/14/19 2300 04/14/19 2307 04/15/19 0323 04/15/19 0420  NA 139 138 139 138  K 4.3 4.1 3.9 4.0  CL 112* 109  --  109  CO2 21*  --   --  21*  GLUCOSE 130* 120*  --  133*  BUN 9 8  --  8  CREATININE 0.92 0.70  --  0.97  CALCIUM 7.0*  --   --  7.2*    CMET: Lab Results  Component Value Date   WBC 8.7 04/15/2019   HGB 11.5 (L) 04/15/2019   HCT 35.4 (L) 04/15/2019   PLT 128 (L) 04/15/2019   GLUCOSE 133 (H) 04/15/2019   CHOL 209 (H) 04/13/2019   TRIG 274 (H) 04/13/2019   HDL 30 (L) 04/13/2019   LDLCALC 124 (H) 04/13/2019   ALT 26 04/13/2019   AST 19 04/13/2019   NA 138 04/15/2019   K 4.0 04/15/2019   CL 109 04/15/2019   CREATININE 0.97 04/15/2019   BUN 8 04/15/2019   CO2 21 (L) 04/15/2019   TSH 3.269 03/02/2008   INR 1.5 (H) 04/14/2019   HGBA1C 7.8 (H) 04/13/2019      PT/INR:  Recent Labs    04/14/19 1706  LABPROT 17.9*  INR 1.5*   Radiology: DG Chest Port 1 View  Result Date: 04/14/2019 CLINICAL DATA:  Status post CABG EXAM: PORTABLE CHEST 1 VIEW COMPARISON:  04/12/2019 FINDINGS: Interval postoperative findings of median sternotomy and CABG with support apparatus including endotracheal tube, right neck pulmonary arterial catheter, esophagogastric tube, and left chest and mediastinal drainage tubes. No significant pneumothorax. There is total atelectasis of the right upper lobe. IMPRESSION: 1. Postoperative findings of median sternotomy and CABG. 2. Left chest and mediastinal drainage tubes. No significant pneumothorax. 3. Total atelectasis of the right upper lobe, likely due to mucous plugging. Endotracheal tube is appropriately positioned over the mid trachea. Electronically Signed   By: Lauralyn Primes M.D.   On: 04/14/2019 17:58   Chest x-ray done this morning not been read by radiology yet, right upper lobe collapse has resolved  Assessment/Plan: S/P Procedure(s) (LRB): CORONARY ARTERY BYPASS GRAFTING (CABG)x4. Left  intermal mammary take down and endovein harvest right greater saphenous vein. LIMA to LAD. Vein graft to RPD, OM with sequential to distal circ. (N/A) TRANSESOPHAGEAL ECHOCARDIOGRAM (TEE) (N/A) Application Of Wound Vac Sternal Closure using KLS Sternal Plating Set. (Left) Mobilize Diuresis Diabetes control d/c tubes/lines See progression orders Expected Acute  Blood - loss Anemia- continue to monitor      Delight Ovens 04/15/2019 8:55 AM

## 2019-04-15 NOTE — Progress Notes (Signed)
   Stable without rhythm or hemodynamic issues.

## 2019-04-16 ENCOUNTER — Inpatient Hospital Stay (HOSPITAL_COMMUNITY): Payer: Managed Care, Other (non HMO)

## 2019-04-16 DIAGNOSIS — Z951 Presence of aortocoronary bypass graft: Secondary | ICD-10-CM

## 2019-04-16 DIAGNOSIS — I480 Paroxysmal atrial fibrillation: Secondary | ICD-10-CM

## 2019-04-16 LAB — CBC
HCT: 35.1 % — ABNORMAL LOW (ref 36.0–46.0)
Hemoglobin: 11.4 g/dL — ABNORMAL LOW (ref 12.0–15.0)
MCH: 28.9 pg (ref 26.0–34.0)
MCHC: 32.5 g/dL (ref 30.0–36.0)
MCV: 88.9 fL (ref 80.0–100.0)
Platelets: 139 10*3/uL — ABNORMAL LOW (ref 150–400)
RBC: 3.95 MIL/uL (ref 3.87–5.11)
RDW: 14.7 % (ref 11.5–15.5)
WBC: 9.9 10*3/uL (ref 4.0–10.5)
nRBC: 0 % (ref 0.0–0.2)

## 2019-04-16 LAB — BASIC METABOLIC PANEL
Anion gap: 9 (ref 5–15)
BUN: 10 mg/dL (ref 6–20)
CO2: 25 mmol/L (ref 22–32)
Calcium: 7.7 mg/dL — ABNORMAL LOW (ref 8.9–10.3)
Chloride: 102 mmol/L (ref 98–111)
Creatinine, Ser: 0.98 mg/dL (ref 0.44–1.00)
GFR calc Af Amer: >60 mL/min (ref >60)
GFR calc non Af Amer: >60 mL/min (ref >60)
Glucose, Bld: 166 mg/dL — ABNORMAL HIGH (ref 70–99)
Potassium: 3.7 mmol/L (ref 3.5–5.1)
Sodium: 136 mmol/L (ref 135–145)

## 2019-04-16 LAB — GLUCOSE, CAPILLARY
Glucose-Capillary: 162 mg/dL — ABNORMAL HIGH (ref 70–99)
Glucose-Capillary: 179 mg/dL — ABNORMAL HIGH (ref 70–99)
Glucose-Capillary: 177 mg/dL — ABNORMAL HIGH (ref 70–99)
Glucose-Capillary: 169 mg/dL — ABNORMAL HIGH (ref 70–99)
Glucose-Capillary: 189 mg/dL — ABNORMAL HIGH (ref 70–99)

## 2019-04-16 MED ORDER — POTASSIUM CHLORIDE CRYS ER 20 MEQ PO TBCR
20.0000 meq | EXTENDED_RELEASE_TABLET | ORAL | Status: AC
  Administered 2019-04-16 (×3): 20 meq via ORAL
  Filled 2019-04-16 (×3): qty 1

## 2019-04-16 MED ORDER — FUROSEMIDE 10 MG/ML IJ SOLN
40.0000 mg | Freq: Two times a day (BID) | INTRAMUSCULAR | Status: AC
  Administered 2019-04-16 (×2): 40 mg via INTRAVENOUS
  Filled 2019-04-16 (×2): qty 4

## 2019-04-16 MED ORDER — ENOXAPARIN SODIUM 60 MG/0.6ML ~~LOC~~ SOLN
50.0000 mg | Freq: Every day | SUBCUTANEOUS | Status: DC
  Administered 2019-04-16 – 2019-04-19 (×4): 50 mg via SUBCUTANEOUS
  Filled 2019-04-16 (×6): qty 0.5

## 2019-04-16 NOTE — Progress Notes (Signed)
Patient ID: Michele [name], female   DOB: Aug 22, 1960, 58 y.o.   MRN: [id number] TCTS DAILY ICU PROGRESS NOTE                   [address]            [place] [address]          [phone]   2 Days Post-Op Procedure(s) (LRB): CORONARY ARTERY BYPASS GRAFTING (CABG)x4. Left intermal mammary take down and endovein harvest right greater saphenous vein. LIMA to LAD. Vein graft to RPD, OM with sequential to distal circ. (N/A) TRANSESOPHAGEAL ECHOCARDIOGRAM (TEE) (N/A) Application Of Wound Vac Sternal Closure using KLS Sternal Plating Set. (Left)  Total Length of Stay:  LOS: 3 days   Subjective: Up to chair this morning much better respiratory effort today, pain control improved  Objective: Vital signs in last 24 hours: Temp:  [98.2 F (36.8 C)-99.7 F (37.6 C)] 98.3 F (36.8 C) (12/13 0752) Pulse Rate:  [45-169] 95 (12/13 0600) Cardiac Rhythm: Normal sinus rhythm (12/13 0600) Resp:  [12-27] 26 (12/13 0600) BP: (69-138)/(33-106) 138/77 (12/13 0600) SpO2:  [89 %-96 %] 96 % (12/13 0805) Arterial Line BP: (98-142)/(45-88) 119/88 (12/12 1500) Weight:  [132 kg] 132 kg (12/13 0500)  Filed Weights   04/14/19 0812 04/15/19 0500 04/16/19 0500  Weight: 128 kg 133 kg 132 kg    Weight change: 4 kg   Hemodynamic parameters for last 24 hours: PAP: (23-25)/(17-19) 23/19  Intake/Output from previous day: 12/12 0701 - 12/13 0700 In: 1668.9 [P.O.:960; I.V.:581.9; IV Piggyback:127] Out: 2730 [Urine:2460; Chest Tube:270]  Intake/Output this shift: Total I/O In: -  Out: 86 [Urine:80; Chest Tube:6]  Current Meds: Scheduled Meds: . acetaminophen  1,000 mg Oral Q6H   Or  . acetaminophen (TYLENOL) oral liquid 160 mg/5 mL  1,000 mg Per Tube Q6H  . aspirin EC  325 mg Oral Daily   Or  . aspirin  324 mg Per Tube Daily  . atorvastatin  80 mg Oral q1800  . bisacodyl  10 mg Oral Daily   Or  . bisacodyl  10 mg Rectal Daily  . Chlorhexidine Gluconate Cloth  6 each  Topical Daily  . docusate sodium  200 mg Oral Daily  . enoxaparin (LOVENOX) injection  40 mg Subcutaneous QHS  . insulin aspart  0-24 Units Subcutaneous Q4H  . insulin detemir  10 Units Subcutaneous Daily  . levalbuterol  0.63 mg Nebulization BID  . metoprolol tartrate  12.5 mg Oral BID   Or  . metoprolol tartrate  12.5 mg Per Tube BID  . mupirocin ointment  1 application Nasal BID  . pantoprazole  40 mg Oral Daily  . potassium chloride  20 mEq Oral Q4H  . sodium chloride flush  10-40 mL Intracatheter Q12H  . sodium chloride flush  3 mL Intravenous Q12H   Continuous Infusions: . sodium chloride Stopped (04/15/19 0757)  . sodium chloride    . sodium chloride 10 mL/hr at 04/15/19 2100  . dexmedetomidine (PRECEDEX) IV infusion Stopped (04/15/19 0518)  . insulin Stopped (04/15/19 1621)  . lactated ringers    . lactated ringers    . lactated ringers Stopped (04/15/19 1817)  . nitroGLYCERIN Stopped (04/14/19 1711)  . phenylephrine (NEO-SYNEPHRINE) Adult infusion Stopped (04/15/19 0607)   PRN Meds:.sodium chloride, dextrose, lactated ringers, metoprolol tartrate, midazolam, morphine injection, ondansetron (ZOFRAN) IV, oxyCODONE, pneumococcal 23 valent vaccine, sodium chloride flush, sodium chloride flush, traMADol  General appearance: alert and no  distress Neurologic: intact Heart: regular rate and rhythm, S1, S2 normal, no murmur, click, rub or gallop Lungs: diminished breath sounds bibasilar Abdomen: soft, non-tender; bowel sounds normal; no masses,  no organomegaly Extremities: extremities normal, atraumatic, no cyanosis or edema and Homans sign is negative, no sign of DVT Wound: Incisional wound VAC in place sternum stable  Lab Results: CBC: Recent Labs    04/15/19 1851 04/16/19 0250  WBC 10.0 9.9  HGB 12.0 11.4*  HCT 37.4 35.1*  PLT 134* 139*   BMET:  Recent Labs    04/15/19 1851 04/16/19 0250  NA 137 136  K 4.0 3.7  CL 104 102  CO2 24 25  GLUCOSE 250* 166*   BUN 12 10  CREATININE 1.31* 0.98  CALCIUM 7.6* 7.7*    CMET: Lab Results  Component Value Date   WBC 9.9 04/16/2019   HGB 11.4 (L) 04/16/2019   HCT 35.1 (L) 04/16/2019   PLT 139 (L) 04/16/2019   GLUCOSE 166 (H) 04/16/2019   CHOL 209 (H) 04/13/2019   TRIG 274 (H) 04/13/2019   HDL 30 (L) 04/13/2019   LDLCALC 124 (H) 04/13/2019   ALT 26 04/13/2019   AST 19 04/13/2019   NA 136 04/16/2019   K 3.7 04/16/2019   CL 102 04/16/2019   CREATININE 0.98 04/16/2019   BUN 10 04/16/2019   CO2 25 04/16/2019   TSH 3.269 03/02/2008   INR 1.5 (H) 04/14/2019   HGBA1C 7.8 (H) 04/13/2019      PT/INR:  Recent Labs    04/14/19 1706  LABPROT 17.9*  INR 1.5*   Radiology: No results found.   Assessment/Plan: S/P Procedure(s) (LRB): CORONARY ARTERY BYPASS GRAFTING (CABG)x4. Left intermal mammary take down and endovein harvest right greater saphenous vein. LIMA to LAD. Vein graft to RPD, OM with sequential to distal circ. (N/A) TRANSESOPHAGEAL ECHOCARDIOGRAM (TEE) (N/A) Application Of Wound Vac Sternal Closure using KLS Sternal Plating Set. 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

## 2019-04-16 NOTE — Progress Notes (Signed)
Flutter valve brought to room, pt was asleep.  Flutter valve in room for instruction later when pt is awake.

## 2019-04-16 NOTE — Progress Notes (Signed)
Progress Note  Patient Name: Michele [name] Date of Encounter: 04/16/2019  Primary Cardiologist: Lauree Chandler, MD   Subjective   Sitting at bedside.  No shortness of breath.  Some chest discomfort.  Inpatient Medications    Scheduled Meds: . acetaminophen  1,000 mg Oral Q6H   Or  . acetaminophen (TYLENOL) oral liquid 160 mg/5 mL  1,000 mg Per Tube Q6H  . aspirin EC  325 mg Oral Daily   Or  . aspirin  324 mg Per Tube Daily  . atorvastatin  80 mg Oral q1800  . bisacodyl  10 mg Oral Daily   Or  . bisacodyl  10 mg Rectal Daily  . Chlorhexidine Gluconate Cloth  6 each Topical Daily  . docusate sodium  200 mg Oral Daily  . enoxaparin (LOVENOX) injection  50 mg Subcutaneous QHS  . furosemide  40 mg Intravenous Q12H  . insulin aspart  0-24 Units Subcutaneous Q4H  . insulin detemir  10 Units Subcutaneous Daily  . levalbuterol  0.63 mg Nebulization BID  . metoprolol tartrate  12.5 mg Oral BID   Or  . metoprolol tartrate  12.5 mg Per Tube BID  . mupirocin ointment  1 application Nasal BID  . pantoprazole  40 mg Oral Daily  . potassium chloride  20 mEq Oral Q4H  . sodium chloride flush  10-40 mL Intracatheter Q12H  . sodium chloride flush  3 mL Intravenous Q12H   Continuous Infusions: . sodium chloride Stopped (04/15/19 0757)  . sodium chloride    . sodium chloride 10 mL/hr at 04/15/19 2100  . insulin Stopped (04/15/19 1621)  . lactated ringers    . lactated ringers    . lactated ringers Stopped (04/15/19 1817)   PRN Meds: sodium chloride, dextrose, lactated ringers, metoprolol tartrate, ondansetron (ZOFRAN) IV, oxyCODONE, pneumococcal 23 valent vaccine, sodium chloride flush, sodium chloride flush, traMADol   Vital Signs    Vitals:   04/16/19 0600 04/16/19 0752 04/16/19 0800 04/16/19 0805  BP: 138/77  (!) 146/64   Pulse: 95  (!) 105   Resp: (!) 26  17   Temp:  98.3 F (36.8 C)    TempSrc:  Oral    SpO2: 94%  91% 96%  Weight:      Height:         Intake/Output Summary (Last 24 hours) at 04/16/2019 0929 Last data filed at 04/16/2019 0745 Gross per 24 hour  Intake 1300.47 ml  Output 2706 ml  Net -1405.53 ml   Last 3 Weights 04/16/2019 04/15/2019 04/14/2019  Weight (lbs) 291 lb 0.1 oz 293 lb 3.4 oz 282 lb 3 oz  Weight (kg) 132 kg 133 kg 128 kg      Telemetry    Sinus tachycardia around 105 bpm.- Personally Reviewed  ECG    Performed on 04/15/2019 demonstrates sinus rhythm with nonspecific ST abnormality- Personally Reviewed  Physical Exam  Obese GEN: No acute distress.   Neck:  Unable to effectively evaluate JVD Cardiac: No obvious pericardial rub Respiratory:  Sternal incision is not draining. GI: Soft, nontender, non-distended  MS: No edema; No deformity. Neuro:  Nonfocal  Psych: Normal affect   Labs    High Sensitivity Troponin:   Recent Labs  Lab 04/12/19 0917 04/12/19 1145  TROPONINIHS 260* 449*      Chemistry Recent Labs  Lab 04/12/19 0917 04/13/19 1809 04/15/19 0420 04/15/19 1851 04/16/19 0250  NA 134* 137 138 137 136  K 3.4* 3.6 4.0 4.0 3.7  CL 101 102 109 104 102  CO2 23 25 21* 24 25  GLUCOSE 204* 177* 133* 250* 166*  BUN 15 13 8 12 10   CREATININE 1.11* 0.99 0.97 1.31* 0.98  CALCIUM 9.9 8.4* 7.2* 7.6* 7.7*  PROT 7.7 6.6  --   --   --   ALBUMIN 3.8 2.9*  --   --   --   AST 26 19  --   --   --   ALT 29 26  --   --   --   ALKPHOS 93 75  --   --   --   BILITOT 0.5 0.7  --   --   --   GFRNONAA 55* >60 >60 45* >60  GFRAA >60 >60 >60 52* >60  ANIONGAP 10 10 8 9 9      Hematology Recent Labs  Lab 04/15/19 0420 04/15/19 1851 04/16/19 0250  WBC 8.7 10.0 9.9  RBC 3.92 4.13 3.95  HGB 11.5* 12.0 11.4*  HCT 35.4* 37.4 35.1*  MCV 90.3 90.6 88.9  MCH 29.3 29.1 28.9  MCHC 32.5 32.1 32.5  RDW 14.6 14.8 14.7  PLT 128* 134* 139*    BNPNo results for input(s): BNP, PROBNP in the last 168 hours.   DDimer No results for input(s): DDIMER in the last 168 hours.   Radiology    DG  Chest Port 1 View  Result Date: 04/15/2019 CLINICAL DATA:  Post CABG. EXAM: PORTABLE CHEST 1 VIEW COMPARISON:  Chest radiograph 04/14/2019 FINDINGS: Interval extubation and removal of NG tube. Left chest tube and mediastinal drain remain in place. Unchanged position of the PA catheter. Postoperative appearance of the cardiomediastinal contours status post median sternotomy and CABG. Improved aeration of the right upper lobe. Bandlike opacities in the right lung and left base likely reflect atelectasis. Mild prominence of the interstitium likely reflecting trace edema. No canal pneumothorax or large pleural effusion. IMPRESSION: Improved aeration of the right upper lobe with scattered bilateral bandlike opacities likely reflecting atelectasis. Probable trace edema. Electronically Signed   By: 14/04/2019 M.D.   On: 04/15/2019 09:47   DG Chest Port 1 View  Result Date: 04/14/2019 CLINICAL DATA:  Status post CABG EXAM: PORTABLE CHEST 1 VIEW COMPARISON:  04/12/2019 FINDINGS: Interval postoperative findings of median sternotomy and CABG with support apparatus including endotracheal tube, right neck pulmonary arterial catheter, esophagogastric tube, and left chest and mediastinal drainage tubes. No significant pneumothorax. There is total atelectasis of the right upper lobe. IMPRESSION: 1. Postoperative findings of median sternotomy and CABG. 2. Left chest and mediastinal drainage tubes. No significant pneumothorax. 3. Total atelectasis of the right upper lobe, likely due to mucous plugging. Endotracheal tube is appropriately positioned over the mid trachea. Electronically Signed   By: 14/03/2019 M.D.   On: 04/14/2019 17:58    Cardiac Studies  Preop Echocardiogram 04/13/2019  Precath/surgery ejection fraction 70%  Normal atrial size  Patient Profile     58 y.o. female  no reported past medical history who presented with chest pain the midsternal point. Found to have elevated troponin and  transferred to [facility] for further work-up. Underwent cath noted above with 3v disease, followed by CABG with LIMA to LAD, vein graft to posterior descending, and sequential obtuse marginal and distal circumflex SVG.  Assessment & Plan    1. Postoperative atrial fibrillation: Resolved last evening with IV beta-blocker.  No recurrence yet this morning.  If recurrent AF may require amiodarone. 2. CAD with four-vessel coronary grafting:  Progressing well. 3. Moderate obesity: Lifestyle changes and cardiac rehab     For questions or updates, please contact [facility] Please consult [email or web address] for contact info under        Signed, Lesleigh NoeHenry W Mayvis Agudelo III, MD  04/16/2019, 9:29 AM

## 2019-04-16 NOTE — Progress Notes (Signed)
Patient ID: Michele [name], female   DOB: November 07, 1960, 58 y.o.   MRN: [id number] EVENING ROUNDS NOTE :     [address]       [place] [address]             [phone]                 2 Days Post-Op Procedure(s) (LRB): CORONARY ARTERY BYPASS GRAFTING (CABG)x4. Left intermal mammary take down and endovein harvest right greater saphenous vein. LIMA to LAD. Vein graft to RPD, OM with sequential to distal circ. (N/A) TRANSESOPHAGEAL ECHOCARDIOGRAM (TEE) (N/A) Application Of Wound Vac Sternal Closure using KLS Sternal Plating Set. (Left)  Total Length of Stay:  LOS: 3 days  BP 114/69   Pulse 95   Temp 99.5 F (37.5 C) (Oral)   Resp 17   Ht 5\' 4"  (1.626 m)   Wt 132 kg   SpO2 98%   BMI 49.95 kg/m   .Intake/Output      12/12 0701 - 12/13 0700 12/13 0701 - 12/14 0700   P.O. 960 360   I.V. (mL/kg) 581.9 (4.4)    Blood     Other 0    IV Piggyback 127    Total Intake(mL/kg) 1668.9 (12.6) 360 (2.7)   Urine (mL/kg/hr) 2460 (0.8) 1205 (0.9)   Drains 0 0   Blood     Chest Tube 270 6   Total Output 2730 1211   Net -1061.1 -851          . sodium chloride Stopped (04/15/19 0757)  . sodium chloride    . sodium chloride 10 mL/hr at 04/15/19 2100  . insulin Stopped (04/15/19 1621)  . lactated ringers    . lactated ringers    . lactated ringers Stopped (04/15/19 1817)     Lab Results  Component Value Date   WBC 9.9 04/16/2019   HGB 11.4 (L) 04/16/2019   HCT 35.1 (L) 04/16/2019   PLT 139 (L) 04/16/2019   GLUCOSE 166 (H) 04/16/2019   CHOL 209 (H) 04/13/2019   TRIG 274 (H) 04/13/2019   HDL 30 (L) 04/13/2019   LDLCALC 124 (H) 04/13/2019   ALT 26 04/13/2019   AST 19 04/13/2019   NA 136 04/16/2019   K 3.7 04/16/2019   CL 102 04/16/2019   CREATININE 0.98 04/16/2019   BUN 10 04/16/2019   CO2 25 04/16/2019   TSH 3.269 03/02/2008   INR 1.5 (H) 04/14/2019   HGBA1C 7.8 (H) 04/13/2019   Stable day Good resp effort    Grace Isaac MD  Beeper  [phone] Office [phone] 04/16/2019 5:06 PM

## 2019-04-16 NOTE — Plan of Care (Signed)
  Problem: Education: Goal: Knowledge of General Education information will improve Description: Including pain rating scale, medication(s)/side effects and non-pharmacologic comfort measures Outcome: Progressing   Problem: Clinical Measurements: Goal: Respiratory complications will improve Outcome: Progressing Goal: Cardiovascular complication will be avoided Outcome: Progressing   Problem: Activity: Goal: Risk for activity intolerance will decrease Outcome: Progressing   Problem: Nutrition: Goal: Adequate nutrition will be maintained Outcome: Progressing   Problem: Pain Managment: Goal: General experience of comfort will improve Outcome: Progressing   Problem: Urinary Elimination: Goal: Ability to achieve and maintain adequate renal perfusion and functioning will improve Outcome: Progressing

## 2019-04-17 ENCOUNTER — Inpatient Hospital Stay (HOSPITAL_COMMUNITY): Payer: Managed Care, Other (non HMO)

## 2019-04-17 LAB — CBC
HCT: 32.7 % — ABNORMAL LOW (ref 36.0–46.0)
Hemoglobin: 10.5 g/dL — ABNORMAL LOW (ref 12.0–15.0)
MCH: 28.9 pg (ref 26.0–34.0)
MCHC: 32.1 g/dL (ref 30.0–36.0)
MCV: 90.1 fL (ref 80.0–100.0)
Platelets: 181 10*3/uL (ref 150–400)
RBC: 3.63 MIL/uL — ABNORMAL LOW (ref 3.87–5.11)
RDW: 15 % (ref 11.5–15.5)
WBC: 9.3 10*3/uL (ref 4.0–10.5)
nRBC: 0 % (ref 0.0–0.2)

## 2019-04-17 LAB — BPAM RBC
Blood Product Expiration Date: 202101142359
Blood Product Expiration Date: 202101142359
Unit Type and Rh: 6200
Unit Type and Rh: 6200

## 2019-04-17 LAB — POCT I-STAT 7, (LYTES, BLD GAS, ICA,H+H)
Acid-Base Excess: 1 mmol/L (ref 0.0–2.0)
Bicarbonate: 26.1 mmol/L (ref 20.0–28.0)
Bicarbonate: 25.7 mmol/L (ref 20.0–28.0)
Calcium, Ion: 0.99 mmol/L — ABNORMAL LOW (ref 1.15–1.40)
Calcium, Ion: 1.08 mmol/L — ABNORMAL LOW (ref 1.15–1.40)
HCT: 27 % — ABNORMAL LOW (ref 36.0–46.0)
HCT: 23 % — ABNORMAL LOW (ref 36.0–46.0)
Hemoglobin: 9.2 g/dL — ABNORMAL LOW (ref 12.0–15.0)
Hemoglobin: 7.8 g/dL — ABNORMAL LOW (ref 12.0–15.0)
O2 Saturation: 100 %
O2 Saturation: 100 %
Potassium: 3.4 mmol/L — ABNORMAL LOW (ref 3.5–5.1)
Potassium: 3.6 mmol/L (ref 3.5–5.1)
Sodium: 139 mmol/L (ref 135–145)
Sodium: 139 mmol/L (ref 135–145)
TCO2: 27 mmol/L (ref 22–32)
TCO2: 27 mmol/L (ref 22–32)
pCO2 arterial: 41.9 mmHg (ref 32.0–48.0)
pCO2 arterial: 44.1 mmHg (ref 32.0–48.0)
pH, Arterial: 7.402 (ref 7.350–7.450)
pH, Arterial: 7.373 (ref 7.350–7.450)
pO2, Arterial: 344 mmHg — ABNORMAL HIGH (ref 83.0–108.0)
pO2, Arterial: 232 mmHg — ABNORMAL HIGH (ref 83.0–108.0)

## 2019-04-17 LAB — POCT I-STAT, CHEM 8
BUN: 10 mg/dL (ref 6–20)
BUN: 9 mg/dL (ref 6–20)
BUN: 10 mg/dL (ref 6–20)
BUN: 9 mg/dL (ref 6–20)
BUN: 9 mg/dL (ref 6–20)
BUN: 9 mg/dL (ref 6–20)
Calcium, Ion: 1.16 mmol/L (ref 1.15–1.40)
Calcium, Ion: 1.13 mmol/L — ABNORMAL LOW (ref 1.15–1.40)
Calcium, Ion: 1.05 mmol/L — ABNORMAL LOW (ref 1.15–1.40)
Calcium, Ion: 1.07 mmol/L — ABNORMAL LOW (ref 1.15–1.40)
Calcium, Ion: 1.08 mmol/L — ABNORMAL LOW (ref 1.15–1.40)
Calcium, Ion: 1.06 mmol/L — ABNORMAL LOW (ref 1.15–1.40)
Chloride: 102 mmol/L (ref 98–111)
Chloride: 103 mmol/L (ref 98–111)
Chloride: 100 mmol/L (ref 98–111)
Chloride: 101 mmol/L (ref 98–111)
Chloride: 102 mmol/L (ref 98–111)
Chloride: 103 mmol/L (ref 98–111)
Creatinine, Ser: 0.8 mg/dL (ref 0.44–1.00)
Creatinine, Ser: 0.9 mg/dL (ref 0.44–1.00)
Creatinine, Ser: 0.7 mg/dL (ref 0.44–1.00)
Creatinine, Ser: 0.8 mg/dL (ref 0.44–1.00)
Creatinine, Ser: 0.8 mg/dL (ref 0.44–1.00)
Creatinine, Ser: 0.8 mg/dL (ref 0.44–1.00)
Glucose, Bld: 143 mg/dL — ABNORMAL HIGH (ref 70–99)
Glucose, Bld: 126 mg/dL — ABNORMAL HIGH (ref 70–99)
Glucose, Bld: 117 mg/dL — ABNORMAL HIGH (ref 70–99)
Glucose, Bld: 126 mg/dL — ABNORMAL HIGH (ref 70–99)
Glucose, Bld: 139 mg/dL — ABNORMAL HIGH (ref 70–99)
Glucose, Bld: 146 mg/dL — ABNORMAL HIGH (ref 70–99)
HCT: 36 % (ref 36.0–46.0)
HCT: 30 % — ABNORMAL LOW (ref 36.0–46.0)
HCT: 27 % — ABNORMAL LOW (ref 36.0–46.0)
HCT: 27 % — ABNORMAL LOW (ref 36.0–46.0)
HCT: 27 % — ABNORMAL LOW (ref 36.0–46.0)
HCT: 28 % — ABNORMAL LOW (ref 36.0–46.0)
Hemoglobin: 12.2 g/dL (ref 12.0–15.0)
Hemoglobin: 10.2 g/dL — ABNORMAL LOW (ref 12.0–15.0)
Hemoglobin: 9.2 g/dL — ABNORMAL LOW (ref 12.0–15.0)
Hemoglobin: 9.2 g/dL — ABNORMAL LOW (ref 12.0–15.0)
Hemoglobin: 9.2 g/dL — ABNORMAL LOW (ref 12.0–15.0)
Hemoglobin: 9.5 g/dL — ABNORMAL LOW (ref 12.0–15.0)
Potassium: 3.4 mmol/L — ABNORMAL LOW (ref 3.5–5.1)
Potassium: 3.2 mmol/L — ABNORMAL LOW (ref 3.5–5.1)
Potassium: 3.3 mmol/L — ABNORMAL LOW (ref 3.5–5.1)
Potassium: 3.5 mmol/L (ref 3.5–5.1)
Potassium: 4 mmol/L (ref 3.5–5.1)
Potassium: 3.6 mmol/L (ref 3.5–5.1)
Sodium: 139 mmol/L (ref 135–145)
Sodium: 138 mmol/L (ref 135–145)
Sodium: 137 mmol/L (ref 135–145)
Sodium: 139 mmol/L (ref 135–145)
Sodium: 137 mmol/L (ref 135–145)
Sodium: 139 mmol/L (ref 135–145)
TCO2: 28 mmol/L (ref 22–32)
TCO2: 24 mmol/L (ref 22–32)
TCO2: 28 mmol/L (ref 22–32)
TCO2: 29 mmol/L (ref 22–32)
TCO2: 28 mmol/L (ref 22–32)
TCO2: 25 mmol/L (ref 22–32)

## 2019-04-17 LAB — TYPE AND SCREEN
ABO/RH(D): A POS
Antibody Screen: NEGATIVE
Unit division: 0
Unit division: 0

## 2019-04-17 LAB — GLUCOSE, CAPILLARY
Glucose-Capillary: 136 mg/dL — ABNORMAL HIGH (ref 70–99)
Glucose-Capillary: 144 mg/dL — ABNORMAL HIGH (ref 70–99)
Glucose-Capillary: 150 mg/dL — ABNORMAL HIGH (ref 70–99)
Glucose-Capillary: 160 mg/dL — ABNORMAL HIGH (ref 70–99)
Glucose-Capillary: 166 mg/dL — ABNORMAL HIGH (ref 70–99)
Glucose-Capillary: 184 mg/dL — ABNORMAL HIGH (ref 70–99)

## 2019-04-17 LAB — BASIC METABOLIC PANEL
Anion gap: 8 (ref 5–15)
BUN: 15 mg/dL (ref 6–20)
CO2: 28 mmol/L (ref 22–32)
Calcium: 7.5 mg/dL — ABNORMAL LOW (ref 8.9–10.3)
Chloride: 103 mmol/L (ref 98–111)
Creatinine, Ser: 1.13 mg/dL — ABNORMAL HIGH (ref 0.44–1.00)
GFR calc Af Amer: >60 mL/min (ref >60)
GFR calc non Af Amer: 54 mL/min — ABNORMAL LOW (ref >60)
Glucose, Bld: 191 mg/dL — ABNORMAL HIGH (ref 70–99)
Potassium: 3.4 mmol/L — ABNORMAL LOW (ref 3.5–5.1)
Sodium: 139 mmol/L (ref 135–145)

## 2019-04-17 MED ORDER — POTASSIUM CHLORIDE CRYS ER 20 MEQ PO TBCR
20.0000 meq | EXTENDED_RELEASE_TABLET | ORAL | Status: DC
  Administered 2019-04-17: 20 meq via ORAL
  Filled 2019-04-17: qty 1

## 2019-04-17 MED ORDER — SODIUM CHLORIDE 0.9 % IV SOLN: 250.0000 mL | INTRAVENOUS | Status: DC | PRN

## 2019-04-17 MED ORDER — LIVING WELL WITH DIABETES BOOK
Freq: Once | Status: AC
  Administered 2019-04-17: 09:00:00
  Filled 2019-04-17: qty 1

## 2019-04-17 MED ORDER — ASPIRIN EC 325 MG PO TBEC
325.0000 mg | DELAYED_RELEASE_TABLET | Freq: Every day | ORAL | Status: DC
  Administered 2019-04-17 – 2019-04-20 (×4): 325 mg via ORAL
  Filled 2019-04-17 (×4): qty 1

## 2019-04-17 MED ORDER — ONDANSETRON HCL 4 MG PO TABS: 4.0000 mg | ORAL_TABLET | Freq: Four times a day (QID) | ORAL | Status: DC | PRN

## 2019-04-17 MED ORDER — TRAMADOL HCL 50 MG PO TABS: 50.0000 mg | ORAL_TABLET | ORAL | Status: DC | PRN

## 2019-04-17 MED ORDER — BISACODYL 10 MG RE SUPP: 10.0000 mg | Freq: Every day | RECTAL | Status: DC | PRN

## 2019-04-17 MED ORDER — POTASSIUM CHLORIDE CRYS ER 20 MEQ PO TBCR
20.0000 meq | EXTENDED_RELEASE_TABLET | Freq: Every day | ORAL | Status: DC
  Administered 2019-04-17 – 2019-04-18 (×2): 20 meq via ORAL
  Filled 2019-04-17 (×2): qty 1

## 2019-04-17 MED ORDER — VITAMIN B-12 1000 MCG PO TABS
1000.0000 ug | ORAL_TABLET | Freq: Every day | ORAL | Status: DC
  Administered 2019-04-17 – 2019-04-20 (×4): 1000 ug via ORAL
  Filled 2019-04-17 (×4): qty 1

## 2019-04-17 MED ORDER — SODIUM CHLORIDE 0.9% FLUSH: 3.0000 mL | INTRAVENOUS | Status: DC | PRN

## 2019-04-17 MED ORDER — ALUM & MAG HYDROXIDE-SIMETH 200-200-20 MG/5ML PO SUSP: 15.0000 mL | ORAL | Status: DC | PRN

## 2019-04-17 MED ORDER — ~~LOC~~ CARDIAC SURGERY, PATIENT & FAMILY EDUCATION
Freq: Once | Status: AC
  Administered 2019-04-17: 13:00:00

## 2019-04-17 MED ORDER — ONDANSETRON HCL 4 MG/2ML IJ SOLN: 4.0000 mg | Freq: Four times a day (QID) | INTRAMUSCULAR | Status: DC | PRN

## 2019-04-17 MED ORDER — INSULIN ASPART 100 UNIT/ML ~~LOC~~ SOLN
0.0000 [IU] | Freq: Three times a day (TID) | SUBCUTANEOUS | Status: DC
  Administered 2019-04-17 – 2019-04-18 (×5): 2 [IU] via SUBCUTANEOUS
  Administered 2019-04-18: 4 [IU] via SUBCUTANEOUS
  Administered 2019-04-18: 2 [IU] via SUBCUTANEOUS
  Administered 2019-04-18: 4 [IU] via SUBCUTANEOUS
  Administered 2019-04-19 (×3): 2 [IU] via SUBCUTANEOUS
  Administered 2019-04-20: 4 [IU] via SUBCUTANEOUS

## 2019-04-17 MED ORDER — METOPROLOL TARTRATE 12.5 MG HALF TABLET
12.5000 mg | ORAL_TABLET | Freq: Two times a day (BID) | ORAL | Status: DC
  Administered 2019-04-17 – 2019-04-18 (×3): 12.5 mg via ORAL
  Filled 2019-04-17 (×3): qty 1

## 2019-04-17 MED ORDER — DOCUSATE SODIUM 100 MG PO CAPS
200.0000 mg | ORAL_CAPSULE | Freq: Every day | ORAL | Status: DC
  Administered 2019-04-17 – 2019-04-18 (×2): 200 mg via ORAL
  Filled 2019-04-17 (×2): qty 2

## 2019-04-17 MED ORDER — BISACODYL 5 MG PO TBEC: 10.0000 mg | DELAYED_RELEASE_TABLET | Freq: Every day | ORAL | Status: DC | PRN

## 2019-04-17 MED ORDER — SODIUM CHLORIDE 0.9% FLUSH
3.0000 mL | Freq: Two times a day (BID) | INTRAVENOUS | Status: DC
  Administered 2019-04-17 – 2019-04-20 (×8): 3 mL via INTRAVENOUS

## 2019-04-17 MED ORDER — OXYCODONE HCL 5 MG PO TABS: 5.0000 mg | ORAL_TABLET | ORAL | Status: DC | PRN

## 2019-04-17 MED ORDER — FUROSEMIDE 40 MG PO TABS
40.0000 mg | ORAL_TABLET | Freq: Every day | ORAL | Status: DC
  Administered 2019-04-17 – 2019-04-20 (×4): 40 mg via ORAL
  Filled 2019-04-17 (×4): qty 1

## 2019-04-17 MED ORDER — ACETAMINOPHEN 325 MG PO TABS: 650.0000 mg | ORAL_TABLET | Freq: Four times a day (QID) | ORAL | Status: DC | PRN

## 2019-04-17 MED FILL — Magnesium Sulfate Inj 50%: INTRAMUSCULAR | Qty: 10 | Status: AC

## 2019-04-17 MED FILL — Potassium Chloride Inj 2 mEq/ML: INTRAVENOUS | Qty: 40 | Status: AC

## 2019-04-17 MED FILL — Heparin Sodium (Porcine) Inj 1000 Unit/ML: INTRAMUSCULAR | Qty: 30 | Status: AC

## 2019-04-17 NOTE — Progress Notes (Signed)
Came to ambulate however pt declined. She sts just got back in bed and wants to rest a couple hours. Encouraged IS, flutter, and x2 more walks this afternoon/evening. Voiced understanding. Will f/u tomorrow. [place], ACSM 1:28 PM 04/17/2019

## 2019-04-17 NOTE — Addendum Note (Signed)
Addendum  created 04/17/19 0916 by Josephine Igo, CRNA   Order list changed

## 2019-04-17 NOTE — Progress Notes (Addendum)
Inpatient Diabetes Program Recommendations  AACE/ADA: New Consensus Statement on Inpatient Glycemic Control (2015)  Target Ranges:  Prepandial:   less than 140 mg/dL      Peak postprandial:   less than 180 mg/dL (1-2 hours)      Critically ill patients:  140 - 180 mg/dL   Lab Results  Component Value Date   GLUCAP 144 (H) 04/17/2019   HGBA1C 7.8 (H) 04/13/2019    Review of Glycemic Control Results for Michele, [name] (MRN [id number]) as of 04/17/2019 11:57  Ref. Range 04/16/2019 20:10 04/17/2019 00:06 04/17/2019 04:24 04/17/2019 07:46 04/17/2019 11:38  Glucose-Capillary Latest Ref Range: 70 - 99 mg/dL 189 (H) 184 (H) 166 (H) 160 (H) 144 (H)   Diabetes history: No prior hx noted Current orders for Inpatient glycemic control: Levemir 10 units daily + Novolog 0-24 correction tid  Inpatient Diabetes Program Recommendations:    Noted patient is new onset diabetes. Ordered Living Well With Diabetes and patient education videos. Will plan to see. Blood glucose was 118 on an office visit 05/06/12 in care everywhere ([facility]).  Will plan to speak with pt. Spoke with pt @ bedside about new diagnosis. Discussed A1C results with them and explained what an A1C is, basic pathophysiology of DM Type 2, basic home care, basic diabetes diet nutrition principles, importance of checking CBGs and maintaining good CBG control to prevent long-term and short-term complications. Reviewed signs and symptoms of hyperglycemia and hypoglycemia and how to treat hypoglycemia at home. Also reviewed blood sugar goals at home.  RNs to provide ongoing basic DM education at bedside with this patient. Have ordered educational booklet, insulin starter kit, and DM videos. Have also placed RD consult for DM diet education for this patient.   Thank you, Nani Gasser. Michele Dudek, RN, MSN, CDE  Diabetes Coordinator Inpatient Glycemic Control Team Team Pager [phone] (8am-5pm) 04/17/2019 12:02 PM

## 2019-04-17 NOTE — Progress Notes (Addendum)
[address]       [place] [address]             [phone]      3 Days Post-Op Procedure(s) (LRB): CORONARY ARTERY BYPASS GRAFTING (CABG)x4. Left intermal mammary take down and endovein harvest right greater saphenous vein. LIMA to LAD. Vein graft to RPD, OM with sequential to distal circ. (N/A) TRANSESOPHAGEAL ECHOCARDIOGRAM (TEE) (N/A) Application Of Wound Vac Sternal Closure using KLS Sternal Plating Set. (Left) Subjective: Feels fine   Objective: Vital signs in last 24 hours: Temp:  [97.9 F (36.6 C)-99.5 F (37.5 C)] 98.4 F (36.9 C) (12/14 0300) Pulse Rate:  [85-118] 89 (12/14 0600) Cardiac Rhythm: Normal sinus rhythm (12/14 0400) Resp:  [14-29] 16 (12/14 0600) BP: (92-147)/(58-88) 138/81 (12/14 0600) SpO2:  [91 %-100 %] 94 % (12/14 0748) Weight:  [138.2 kg] 138.2 kg (12/14 0500)  Hemodynamic parameters for last 24 hours:    Intake/Output from previous day: 12/13 0701 - 12/14 0700 In: 720 [P.O.:720] Out: 3121 [Urine:3115; Chest Tube:6] Intake/Output this shift: No intake/output data recorded.  General appearance: alert, cooperative and no distress Heart: regular rate and rhythm Lungs: clesr but takes very shallow breaths Abdomen: soft, nontender Extremities: minor edema Wound: evh site ok, incis Vac in place for sternotomy  Lab Results: Recent Labs    04/16/19 0250 04/17/19 0247  WBC 9.9 9.3  HGB 11.4* 10.5*  HCT 35.1* 32.7*  PLT 139* 181   BMET:  Recent Labs    04/16/19 0250 04/17/19 0247  NA 136 139  K 3.7 3.4*  CL 102 103  CO2 25 28  GLUCOSE 166* 191*  BUN 10 15  CREATININE 0.98 1.13*  CALCIUM 7.7* 7.5*    PT/INR:  Recent Labs    04/14/19 1706  LABPROT 17.9*  INR 1.5*   ABG    Component Value Date/Time   PHART 7.345 (L) 04/15/2019 0323   HCO3 20.3 04/15/2019 0323   TCO2 21 (L) 04/15/2019 0323   ACIDBASEDEF 5.0 (H) 04/15/2019 0323   O2SAT 95.0 04/15/2019 0323   CBG (last 3)  Recent Labs   04/17/19 0006 04/17/19 0424 04/17/19 0746  GLUCAP 184* 166* 160*    Meds Scheduled Meds: . aspirin EC  325 mg Oral Daily  . atorvastatin  80 mg Oral q1800  . Chlorhexidine Gluconate Cloth  6 each Topical Daily  . Cash Cardiac Surgery, Patient & Family Education   Does not apply Once  . docusate sodium  200 mg Oral Daily  . enoxaparin (LOVENOX) injection  50 mg Subcutaneous QHS  . furosemide  40 mg Oral Daily  . insulin aspart  0-24 Units Subcutaneous TID AC & HS  . insulin detemir  10 Units Subcutaneous Daily  . metoprolol tartrate  12.5 mg Oral BID  . mupirocin ointment  1 application Nasal BID  . potassium chloride  20 mEq Oral Daily  . sodium chloride flush  3 mL Intravenous Q12H  . vitamin B-12  1,000 mcg Oral Daily   Continuous Infusions: . sodium chloride     PRN Meds:.sodium chloride, acetaminophen, alum & mag hydroxide-simeth, bisacodyl **OR** bisacodyl, ondansetron **OR** ondansetron (ZOFRAN) IV, oxyCODONE, pneumococcal 23 valent vaccine, sodium chloride flush, traMADol  Xrays DG Chest Port 1 View In am  Result Date: 04/17/2019 CLINICAL DATA:  Chest following CABG EXAM: PORTABLE CHEST 1 VIEW COMPARISON:  04/16/2019 FINDINGS: Previous median sternotomy and CABG. Cardiomegaly as seen previously. Mild venous hypertension and mild  areas of linear atelectasis. No significant change. No lobar collapse. Left chest tube is been removed. No visible pneumothorax. IMPRESSION: Left chest tube has been removed. No pneumothorax. Venous hypertension and areas of linear atelectasis persist. Electronically Signed   By: Paulina Fusi M.D.   On: 04/17/2019 08:08   DG Chest Port 1 View  Result Date: 04/16/2019 CLINICAL DATA:  58 year old female postoperative day 2 CABG. EXAM: PORTABLE CHEST 1 VIEW COMPARISON:  04/15/2019 and earlier. FINDINGS: Portable AP upright view at 0621 hours. Swan-Ganz catheter and right IJ sheath removed. Right chest tube remains in place. Mildly improved  lung volumes. No pneumothorax. Increased pulmonary vascularity but no overt edema. Decreased but not resolved bilateral scattered linear and platelike atelectasis. Stable cardiac size and mediastinal contours. Visualized tracheal air column is within normal limits. Stable visualized osseous structures. IMPRESSION: 1. Swan-Ganz catheter and right IJ sheath removed. 2. Stable left chest tube.  No pneumothorax. 3. Increased vascular congestion without overt edema. Partially regressed atelectasis. Electronically Signed   By: Odessa Fleming M.D.   On: 04/16/2019 11:23    Assessment/Plan: S/P Procedure(s) (LRB): CORONARY ARTERY BYPASS GRAFTING (CABG)x4. Left intermal mammary take down and endovein harvest right greater saphenous vein. LIMA to LAD. Vein graft to RPD, OM with sequential to distal circ. (N/A) TRANSESOPHAGEAL ECHOCARDIOGRAM (TEE) (N/A) Application Of Wound Vac Sternal Closure using KLS Sternal Plating Set. (Left)   1 doing well overall 2 hemodyn stable in sinus with some PAC's sats good on 3 liters, cont routine pulm toilet/cardiac rehab- wean O2 as able 3 CXR appearance is stable, some venous congestion, conts lasix, good UOP, normal renal fxn 4 replace K+ 5 H/H down a little further, still equilibrating- follow clinically 6 no fevers or leukocytosis 7 thrombocytopenia resolved 8 will have Diabetic coordinator see patient for undiagnosed DM, possibly start metformin. 9 Tx to 4e  LOS: 4 days    Rowe Clack PA-C 04/17/2019 Pager [phone]- not for patient use   I have seen and examined Michele [name] and agree with the above assessment  and plan.  Delight Ovens MD Beeper [phone] Office [phone] 04/17/2019 12:19 PM

## 2019-04-17 NOTE — Progress Notes (Signed)
Patient ID: Michele [name], female   DOB: 07/10/1960, 58 y.o.   MRN: [id number] TCTS Evening Rounds:  Hemodynamically stable in sinus rhythm.  Awaiting bed on 4E.

## 2019-04-17 NOTE — Progress Notes (Signed)
Progress Note  Patient Name: Michele [name] Date of Encounter: 04/17/2019  Primary Cardiologist: Lauree Chandler, MD   Subjective   Slept well last night.  Feeling better than yesterday.  Had atrial fibrillation within the past 36 hours but none over the past 24 hours.  The episode that she did have was not recognized by her.  Inpatient Medications    Scheduled Meds: . aspirin EC  325 mg Oral Daily  . atorvastatin  80 mg Oral q1800  . Chlorhexidine Gluconate Cloth  6 each Topical Daily  . [facility], Patient & Family Education   Does not apply Once  . docusate sodium  200 mg Oral Daily  . enoxaparin (LOVENOX) injection  50 mg Subcutaneous QHS  . furosemide  40 mg Oral Daily  . insulin aspart  0-24 Units Subcutaneous TID AC & HS  . insulin detemir  10 Units Subcutaneous Daily  . metoprolol tartrate  12.5 mg Oral BID  . mupirocin ointment  1 application Nasal BID  . potassium chloride  20 mEq Oral Daily  . sodium chloride flush  3 mL Intravenous Q12H  . vitamin B-12  1,000 mcg Oral Daily   Continuous Infusions: . sodium chloride     PRN Meds: sodium chloride, acetaminophen, alum & mag hydroxide-simeth, bisacodyl **OR** bisacodyl, ondansetron **OR** ondansetron (ZOFRAN) IV, oxyCODONE, pneumococcal 23 valent vaccine, sodium chloride flush, traMADol   Vital Signs    Vitals:   04/17/19 0500 04/17/19 0600 04/17/19 0748 04/17/19 0927  BP: 136/71 138/81  (!) 141/84  Pulse: 85 89  100  Resp: 15 16    Temp:      TempSrc:      SpO2: 97% 96% 94%   Weight: (!) 138.2 kg     Height:        Intake/Output Summary (Last 24 hours) at 04/17/2019 1024 Last data filed at 04/17/2019 0400 Gross per 24 hour  Intake 720 ml  Output 3035 ml  Net -2315 ml   Last 3 Weights 04/17/2019 04/16/2019 04/15/2019  Weight (lbs) 304 lb 10.8 oz 291 lb 0.1 oz 293 lb 3.4 oz  Weight (kg) 138.2 kg 132 kg 133 kg      Telemetry    Sinus tachycardia around 105 bpm.-  Personally Reviewed  ECG    Performed on 04/15/2019 demonstrates sinus rhythm with nonspecific ST abnormality- Personally Reviewed  Physical Exam  Obese GEN: No acute distress.   Neck:  Unable to effectively evaluate JVD Cardiac: Heart sounds are distant. Respiratory:  Sternal incision is not draining. GI: Soft, nontender, non-distended  MS: No edema; No deformity. Neuro:  Nonfocal  Psych: Normal affect   Labs    High Sensitivity Troponin:   Recent Labs  Lab 04/12/19 0917 04/12/19 1145  TROPONINIHS 260* 449*      Chemistry Recent Labs  Lab 04/12/19 0917 04/13/19 1809 04/15/19 1851 04/16/19 0250 04/17/19 0247  NA 134* 137 137 136 139  K 3.4* 3.6 4.0 3.7 3.4*  CL 101 102 104 102 103  CO2 23 25 24 25 28   GLUCOSE 204* 177* 250* 166* 191*  BUN 15 13 12 10 15   CREATININE 1.11* 0.99 1.31* 0.98 1.13*  CALCIUM 9.9 8.4* 7.6* 7.7* 7.5*  PROT 7.7 6.6  --   --   --   ALBUMIN 3.8 2.9*  --   --   --   AST 26 19  --   --   --   ALT 29 26  --   --   --  ALKPHOS 93 75  --   --   --   BILITOT 0.5 0.7  --   --   --   GFRNONAA 55* >60 45* >60 54*  GFRAA >60 >60 52* >60 >60  ANIONGAP 10 10 9 9 8      Hematology Recent Labs  Lab 04/15/19 1851 04/16/19 0250 04/17/19 0247  WBC 10.0 9.9 9.3  RBC 4.13 3.95 3.63*  HGB 12.0 11.4* 10.5*  HCT 37.4 35.1* 32.7*  MCV 90.6 88.9 90.1  MCH 29.1 28.9 28.9  MCHC 32.1 32.5 32.1  RDW 14.8 14.7 15.0  PLT 134* 139* 181    BNPNo results for input(s): BNP, PROBNP in the last 168 hours.   DDimer No results for input(s): DDIMER in the last 168 hours.   Radiology    DG Chest Port 1 View In am  Result Date: 04/17/2019 CLINICAL DATA:  Chest following CABG EXAM: PORTABLE CHEST 1 VIEW COMPARISON:  04/16/2019 FINDINGS: Previous median sternotomy and CABG. Cardiomegaly as seen previously. Mild venous hypertension and mild areas of linear atelectasis. No significant change. No lobar collapse. Left chest tube is been removed. No visible  pneumothorax. IMPRESSION: Left chest tube has been removed. No pneumothorax. Venous hypertension and areas of linear atelectasis persist. Electronically Signed   By: 04/18/2019 M.D.   On: 04/17/2019 08:08   DG Chest Port 1 View  Result Date: 04/16/2019 CLINICAL DATA:  58 year old female postoperative day 2 CABG. EXAM: PORTABLE CHEST 1 VIEW COMPARISON:  04/15/2019 and earlier. FINDINGS: Portable AP upright view at 0621 hours. Swan-Ganz catheter and right IJ sheath removed. Right chest tube remains in place. Mildly improved lung volumes. No pneumothorax. Increased pulmonary vascularity but no overt edema. Decreased but not resolved bilateral scattered linear and platelike atelectasis. Stable cardiac size and mediastinal contours. Visualized tracheal air column is within normal limits. Stable visualized osseous structures. IMPRESSION: 1. Swan-Ganz catheter and right IJ sheath removed. 2. Stable left chest tube.  No pneumothorax. 3. Increased vascular congestion without overt edema. Partially regressed atelectasis. Electronically Signed   By: 14/04/2019 M.D.   On: 04/16/2019 11:23    Cardiac Studies  Preop Echocardiogram 04/13/2019  Precath/surgery ejection fraction 70%  Normal atrial size  Patient Profile     58 y.o. female  no reported past medical history who presented with chest pain the midsternal point. Found to have elevated troponin and transferred to [facility] for further work-up. Underwent cath noted above with 3v disease, followed by CABG with LIMA to LAD, vein graft to posterior descending, and sequential obtuse marginal and distal circumflex SVG.  Assessment & Plan    1. Postoperative atrial fibrillation: No recurrent AF in the past 24 hours  Continue to follow clinically.  No changes recommended.   For questions or updates, please contact [facility] Please consult [email or web address] for contact info under        Signed, [facility], MD  04/17/2019, 10:24 AM

## 2019-04-17 NOTE — Op Note (Signed)
NAME: Michele, [name] MEDICAL RECORD DU:[id number] ACCOUNT [id number] DATE OF BIRTH:05-Jul-1960 FACILITY: [facility] LOCATION: [facility] PHYSICIAN:Blayton Huttner BServando Snare, MD  OPERATIVE REPORT  DATE OF PROCEDURE:  04/14/2019  PREOPERATIVE DIAGNOSES:  Coronary occlusive disease with unstable angina; recent non-ST elevation myocardial infarction.  POSTOPERATIVE DIAGNOSES:  Coronary occlusive disease with unstable angina; recent non-ST elevation myocardial infarction.  SURGICAL PROCEDURE:  Coronary artery bypass grafting x4 with the left internal mammary to the left anterior descending coronary artery, reverse saphenous vein graft sequentially to the first obtuse marginal and distal circumflex, reverse saphenous vein  graft to the posterior descending coronary artery with right thigh and calf greater saphenous endoscopic vein harvesting, plating of left side of sternum with KLS plating system.  SURGEON:  Lanelle Bal, MD  FIRST ASSISTANT:  Jadene Pierini, PA  BRIEF HISTORY:  The patient is a 58 year old female with BMI of 40 and a long history of smoking who presents with rapidly escalating episodes of substernal chest discomfort first noted to be as if something was stuck in her throat.  She sought medical  attention.  Troponins were elevated.  She was transferred to [facility] for further evaluation.  Dr. Claiborne Billings performed cardiac catheterization, which demonstrated severe 3-vessel coronary artery disease with subtotal occlusion of the distal  posterolateral branch of the right, diffuse disease through the body of the right coronary artery of greater than 80%.  A small LAD with proximal 80% stenosis.  A large circumflex branch given off a large first obtuse marginal and then distal circumflex  branch.  This had a 90% stenosis.  The midportion of the circumflex had 80-90% stenosis.  Overall, ventricular function was preserved.  With the patient's critical anatomy, especially in the circumflex system and  her symptoms, coronary artery bypass  grafting was recommended.  Her radial arteries were checked with Doppler and were not suitable for harvesting.  The patient agreed to proceeding with surgery and signed informed consent.  DESCRIPTION OF PROCEDURE:  With Swan-Ganz and arterial line monitors in place, the patient underwent general endotracheal anesthesia without incident.  The skin of the chest and legs was prepped with Betadine, draped in usual sterile manner.  Appropriate  timeout was performed.  Then, using the Guidant endovein harvesting system, segment of vein was harvested from the right greater saphenous system thigh and upper calf.  Median sternotomy was performed.  The left internal mammary artery was dissected  down as a pedicle graft.  The distal artery was divided and had excellent free flow.  The pericardium was opened.  The patient had evidence of significant left ventricular hypertrophy.  She was systemically heparinized.  The ascending aorta was  cannulated.  The right atrium was cannulated.  An aortic root vent cardioplegia needle was introduced into the ascending aorta.  The patient was placed on cardiopulmonary bypass 2.4 liters per minute per meter square.  Sites of anastomosis were dissected  out of the epicardium.  The patient's posterior descending coronary artery was small.  The distal posterior lateral branch was too small to consider bypass.  The patient's body temperature was cooled to 32 degrees.  Aortic crossclamp was applied and 600  mL of cold blood potassium cardioplegia was administered antegrade with diastolic arrest of the heart.  Myocardial septal temperatures were monitored throughout the crossclamp.  We turned our attention first to the posterior descending coronary artery.   It was noted that it was a relatively small vessel, admitted 1.5 mm probe distally.  Using a running 7-0 Prolene,  distal anastomosis was performed.  Additional blood cardioplegia was administered  down the vein graft.  The heart was then elevated.  We  turned our attention to the large first obtuse marginal, which was opened and admitted a 1.5 mm probe easily.  Using diamond type side-to-side anastomosis was carried out with a running 7-0 Prolene and a segment of reverse saphenous vein graft.  The  distal extent of the same vein was then carried a short distance to the distal circumflex.  This vessel was smaller, but did admit a 1.5 mm probe.  Using a running 7-0 Prolene, distal anastomosis was performed.  Additional cold blood cardioplegia was  intermittently administered down the vein grafts.  Attention was then turned to the left anterior descending coronary artery.  This vessel was a diffusely small vessel, but was open, and admitted 1 mm probe distally and proximally.  The vessel was 1.3 mm  in size and significantly calcified.  Using a running 8-0 Prolene, the left internal mammary artery was anastomosed to left anterior descending coronary artery.  With cross clamp still in place, 2 punch aortotomies were performed and each of the 2 vein  grafts were anastomosed to the ascending aorta.  The bulldog on the mammary artery was removed with prompt rise in myocardial septal temperature.  The heart was allowed to passively fill and deair and the proximal anastomoses were completed.  The aortic  crossclamp was removed with total crossclamp time of 88 minutes.  Sites of anastomoses were inspected and were free of bleeding.  The patient required electrical defibrillation to return to a sinus rhythm.  Body temperature rewarmed to 38 degrees.   Atrial and ventricular pacing wires were applied.  The patient was then ventilated and weaned from cardiopulmonary bypass without difficulty.  She required no pressors.  She remained hemodynamically stable.  She was decannulated in the usual fashion.   Protamine sulfate was administered.  With the operative field hemostatic, a left pleural tube and Blake  mediastinal drain were left in place.  The pericardium was really too tight to completely close.  Portions of it were closed over the aorta and  grafts.  Because of the patient's significant size and concern about the fragility of her sternum, we did reinforce the left side of her sternal incision with a sternal rib plating system, KLS, with multiple 15 mm self-tapping screws.  The sternum was  then reapproximated with #6 stainless steel wires.  Fascia was closed with interrupted 0 Vicryl, running 3-0 Vicryl for subcutaneous tissue, 3-0 subcuticular stitch in skin edges.  Dry dressings were applied, including an incisional wound VAC.  The  patient was then transferred to the surgical intensive care unit for further postoperative care.  She did not require blood bank blood products during the operative procedure.  Total pump time was 127 minutes.  RF scanning for laps reported clear code.   Sponge and needle count was reported as correct.  The patient tolerated the procedure without obvious complication and was transferred to the surgical intensive care unit for further postoperative care.  JN/NUANCE  D:04/16/2019 T:04/17/2019 JOB:[id number]

## 2019-04-18 ENCOUNTER — Inpatient Hospital Stay (HOSPITAL_COMMUNITY): Payer: Managed Care, Other (non HMO)

## 2019-04-18 ENCOUNTER — Encounter: Payer: Self-pay | Admitting: *Deleted

## 2019-04-18 DIAGNOSIS — E785 Hyperlipidemia, unspecified: Secondary | ICD-10-CM

## 2019-04-18 DIAGNOSIS — I10 Essential (primary) hypertension: Secondary | ICD-10-CM

## 2019-04-18 LAB — CBC
HCT: 32.7 % — ABNORMAL LOW (ref 36.0–46.0)
Hemoglobin: 10.5 g/dL — ABNORMAL LOW (ref 12.0–15.0)
MCH: 28.9 pg (ref 26.0–34.0)
MCHC: 32.1 g/dL (ref 30.0–36.0)
MCV: 90.1 fL (ref 80.0–100.0)
Platelets: 217 10*3/uL (ref 150–400)
RBC: 3.63 MIL/uL — ABNORMAL LOW (ref 3.87–5.11)
RDW: 15.1 % (ref 11.5–15.5)
WBC: 8.1 10*3/uL (ref 4.0–10.5)
nRBC: 0 % (ref 0.0–0.2)

## 2019-04-18 LAB — BASIC METABOLIC PANEL
Anion gap: 10 (ref 5–15)
BUN: 16 mg/dL (ref 6–20)
CO2: 26 mmol/L (ref 22–32)
Calcium: 7.9 mg/dL — ABNORMAL LOW (ref 8.9–10.3)
Chloride: 105 mmol/L (ref 98–111)
Creatinine, Ser: 1.11 mg/dL — ABNORMAL HIGH (ref 0.44–1.00)
GFR calc Af Amer: >60 mL/min (ref >60)
GFR calc non Af Amer: 55 mL/min — ABNORMAL LOW (ref >60)
Glucose, Bld: 137 mg/dL — ABNORMAL HIGH (ref 70–99)
Potassium: 4.1 mmol/L (ref 3.5–5.1)
Sodium: 141 mmol/L (ref 135–145)

## 2019-04-18 LAB — GLUCOSE, CAPILLARY
Glucose-Capillary: 136 mg/dL — ABNORMAL HIGH (ref 70–99)
Glucose-Capillary: 163 mg/dL — ABNORMAL HIGH (ref 70–99)
Glucose-Capillary: 132 mg/dL — ABNORMAL HIGH (ref 70–99)
Glucose-Capillary: 191 mg/dL — ABNORMAL HIGH (ref 70–99)

## 2019-04-18 MED ORDER — LISINOPRIL 10 MG PO TABS
10.0000 mg | ORAL_TABLET | Freq: Every day | ORAL | Status: DC
  Administered 2019-04-18 – 2019-04-20 (×3): 10 mg via ORAL
  Filled 2019-04-18 (×3): qty 1

## 2019-04-18 MED ORDER — METFORMIN HCL 500 MG PO TABS
500.0000 mg | ORAL_TABLET | Freq: Two times a day (BID) | ORAL | Status: DC
  Administered 2019-04-18 (×2): 500 mg via ORAL
  Filled 2019-04-18 (×2): qty 1

## 2019-04-18 MED ORDER — METOPROLOL TARTRATE 25 MG PO TABS
25.0000 mg | ORAL_TABLET | Freq: Two times a day (BID) | ORAL | Status: DC
  Administered 2019-04-18 – 2019-04-20 (×4): 25 mg via ORAL
  Filled 2019-04-18 (×4): qty 1

## 2019-04-18 MED ORDER — METOPROLOL TARTRATE 12.5 MG HALF TABLET
12.5000 mg | ORAL_TABLET | Freq: Once | ORAL | Status: AC
  Administered 2019-04-18: 12.5 mg via ORAL
  Filled 2019-04-18: qty 1

## 2019-04-18 NOTE — Discharge Summary (Addendum)
Physician Discharge Summary  [name]: Michele [name] MRN: [id number] DOB/AGE: 58/11/1960 58 y.o.  Admit date: 04/12/2019 Discharge date: 04/24/2019  Admission DiagnosMallie [name]:Chest pain  Discharge Diagnoses:  Active Problems:   Chest pain   Elevated troponin   Unstable angina (HCC)   S/P CABG x 4   [name] Active Problem List   Diagnosis Date Noted  . Unstable angina (HCC) 04/14/2019  . S/P CABG x 4 04/14/2019  . Chest pain 04/13/2019  . Elevated troponin   . LEG PAIN, RIGHT 03/02/2008  . OBESITY NOS 06/02/2006  . ANXIETY 02/09/2006  . HYPERTENSION, BENIGN SYSTEMIC 02/09/2006   HPI: at time of admission Michele [name] is a 58 year old female with no reported past medical history.  States she has been tried on blood pressure medication in Michele past but has had issues with this dropping her pressures too low, and were therefore stopped.  She is a smoker.  Denies any family history of CAD.  States over Michele past 2 to 3 weeks she has had several episodes of having a sensation of feeling like something is stuck in her throat.  This has happened several times after eating.  States she has struggled with symptoms of acid reflux in Michele past, but has been several years without having any symptoms.  Some episodes have been associated with eating spicy foods, and pizza.  Yesterday morning around 645 she ate a piece of chocolate, and went to get in her car to go to work.  Developed sensation of something stuck in her throat which radiated through to her back.  Sat for several minutes and symptoms eased.  Was able to contract for her and once getting there had return of sensation with chest heaviness.  States this lasted for several hours which concerned her and she [facility].  In Michele ED her labs showed Na+ 134, K+ 3.4, Cr 1.11, HsT 260>>449, LDL unable to be calculated. Blood pressures were noted >200 systolic. EKG showed ST with ST sagging in  inferolateral leads. She was placed on IV heparin and nitro. Transferred [facility] for further management.   Discharged Condition: good  Hospital Course: Michele [name] was admitted in transfer to [facility] for further evaluation and treatment to include cardiac catheterization.  This was performed and she was found to have severe multivessel coronary artery disease.  Due to these findings cardiothoracic surgical consultation was obtained with Sheliah PlaneEdward Ramona Ruark, MD who evaluated Michele [name] and studies and agree with recommendations to proceed with CABG as her best revascularization option due to Michele severity of her disease.  On 04/14/2019 she was taken Michele operating room where she underwent Michele below described procedure.  She tolerated it well was taken to Michele surgical intensive care unit in stable condition.  Postoperative hospital course:  Michele [name] did well postoperatively.  She maintained stable hemodynamics and normal sinus rhythm.  She is weaned from Michele ventilator without difficulty using standard protocols.  She has remained neurologically intact.  It was determined that she does have type 2 diabetes.  Hemoglobin A1c on presentation was 7.8.  She initially used Michele standard insulin postop protocols but has been transitioned to Glucophage.  She has also been instructed on blood sugar checks and was seen by Michele diabetes coordinator for further education.  She also has some difficulty with postoperative hypertension and was started on lisinopril with good improvement.  This will require significant follow-up as an outpatient by primary care  as both diabetes and hypertension appear to be a new diagnoses.  She does have an expected acute blood loss anemia which is mild.  Most recent hemoglobin and hematocrit dated 04/18/2019 were 10.5 and 32.7 respectively.  Renal function has remained stable and most recent BUN and creatinine dated 04/19/2019 were 12 and 1.12 respectively.  Oxygen was weaned and she  maintained good saturations on room air.  She tolerated gradually increasing activities using standard cardiac rehab protocols.  Incisions were noted to be healing well without evidence of infection.  At Michele time of discharge Michele [name] was felt to be quite stable.    Consults: cardiology  Significant Diagnostic Studies: angiography: cardiac catheterization  Treatments: surgery:  OPERTIVE REPORT  DATE OF PROCEDURE:  04/14/2019  PREOPERATIVE DIAGNOSES:  Coronary occlusive disease with unstable angina; recent non-ST elevation myocardial infarction.  POSTOPERATIVE DIAGNOSES:  Coronary occlusive disease with unstable angina; recent non-ST elevation myocardial infarction.  SURGICAL PROCEDURE:  Coronary artery bypass grafting x4 with Michele left internal mammary to Michele left anterior descending coronary artery, reverse saphenous vein graft sequentially to Michele first obtuse marginal and distal circumflex, reverse saphenous vein  graft to Michele posterior descending coronary artery with right thigh and calf greater saphenous endoscopic vein harvesting, plating of left side of sternum with KLS plating system.  SURGEON:  Sheliah Plane, MD  FIRST ASSISTANT:  Gershon Crane, PA-C Discharge Exam: Blood pressure (!) 146/77, pulse 100, temperature 98.4 F (36.9 C), temperature source Oral, resp. rate 20, height 5\' 4"  (1.626 m), weight 127.4 kg, SpO2 94 %.   General appearance: alert, cooperative and no distress Heart: regular rate and rhythm Lungs: clear to auscultation bilaterally Abdomen: benign Extremities: min edema Wound: incis healing well  Disposition: Discharge disposition: 01-Home or Self Care       Discharge Instructions    Amb Referral to Cardiac Rehabilitation   Complete by: As directed    Also refer to [facility]   Diagnosis: CABG   CABG X ___: 4   After initial evaluation and assessments completed: Virtual Based Care may be provided alone or in conjunction with Phase 2  Cardiac Rehab based on [name] barriers.: Yes   Discharge [name]   Complete by: As directed    Discharge disposition: 01-Home or Self Care   Discharge [name] date: 04/20/2019     Allergies as of 04/20/2019   No Known Allergies     Medication List    STOP taking these medications   aspirin-acetaminophen-caffeine 250-250-65 MG tablet Commonly known as: EXCEDRIN MIGRAINE   ibuprofen 200 MG tablet Commonly known as: ADVIL     TAKE these medications   aspirin 325 MG EC tablet Take 1 tablet (325 mg total) by mouth daily.   atorvastatin 80 MG tablet Commonly known as: LIPITOR Take 1 tablet (80 mg total) by mouth daily at 6 PM.   cholecalciferol 25 MCG (1000 UT) tablet Commonly known as: VITAMIN D3 Take 4,000 Units by mouth daily. Notes to [name]: Take as you were at home   lisinopril 20 MG tablet Commonly known as: ZESTRIL Take 1 tablet (20 mg total) by mouth daily.   metFORMIN 850 MG tablet Commonly known as: GLUCOPHAGE Take 1 tablet (850 mg total) by mouth 2 (two) times daily with a meal.   metoprolol tartrate 25 MG tablet Commonly known as: LOPRESSOR Take 1 tablet (25 mg total) by mouth 2 (two) times daily.   traMADol 50 MG tablet Commonly known as: ULTRAM Take 1 tablet (50 mg total)  by mouth every 6 (six) hours as needed for up to 7 days for moderate pain.   vitamin B-12 1000 MCG tablet Commonly known as: CYANOCOBALAMIN Take 1,000 mcg by mouth daily.      Follow-up Information    Grace Isaac, MD Follow up.   Specialty: Cardiothoracic Surgery Why: Please see discharge paperwork for 4-week follow-up appointment with Dr. Servando Snare.  Please also obtain a chest x-ray at [place] 1/2-hour prior to this appointment.  It is located in Michele same office complex. Contact information: [place] [address] [phone]        Almyra Deforest, [place] Follow up on 05/03/2019.   Specialties: Cardiology, Radiology Why: followup  with Dr. Camillia Herter PA in Michele [facility] clinic (not Michele [facility] office) in 2 weeks. 2:00PM. Contact information: [address] [place] [address] [phone]          Michele [name] has been discharged on:   1.Beta Blocker:  Yes Blue.Reese   ]                              No   [   ]                              If No, reason:  2.Ace Inhibitor/ARB: Yes [ y  ]                                     No  [    ]                                     If No, reason:  3.Statin:   Yes [ y  ]                  No  [   ]                  If No, reason:  4.Ecasa:  Yes  [  y ]                  No   [   ]                  If No, reason: Signed: John Giovanni 04/24/2019, 2:02 PM

## 2019-04-18 NOTE — Progress Notes (Signed)
   No recurrence of atrial fibrillation.  Both heart rate and blood pressure are elevated with minimal activity.  We recommend further increasing metoprolol to 25 mg twice daily.  Overall doing quite well.

## 2019-04-18 NOTE — Progress Notes (Signed)
CARDIAC REHAB PHASE I   PRE:  Rate/Rhythm: 101 ST    BP: sitting 160/85    SaO2: 93-97 RA  MODE:  Ambulation: 100 ft   POST:  Rate/Rhythm: 120 ST    BP: sitting to bathroom     SaO2: 95 RA  Pt struggles with keeping sternal precautions (difficult for her to roll). Stood well and walked with RW. C/o chest soreness but able to stand more upright with distance. She was only able to walk 100 ft as she had to rush back to BR for BM. Pt lowered herself to commode with arms. Discussed using her legs more. RN to get higher BSC over commode. HR elevated, SAO2 stable. Encouraged more walking and IS and recliner.  [phone]   [place], ACSM 04/18/2019 10:02 AM

## 2019-04-18 NOTE — Progress Notes (Addendum)
[address]       [place] [address]             [phone]      4 Days Post-Op Procedure(s) (LRB): CORONARY ARTERY BYPASS GRAFTING (CABG)x4. Left intermal mammary take down and endovein harvest [name] greater saphenous vein. LIMA to LAD. Vein graft to RPD, OM with sequential to distal circ. (N/A) TRANSESOPHAGEAL ECHOCARDIOGRAM (TEE) (N/A) Application Of Wound Vac Sternal Closure using KLS Sternal Plating Set. (Left) Subjective: Feels pretty well   Objective: Vital signs in last 24 hours: Temp:  [98.5 F (36.9 C)-99.9 F (37.7 C)] 98.5 F (36.9 C) (12/15 0420) Pulse Rate:  [98-112] 99 (12/15 0420) Cardiac Rhythm: Normal sinus rhythm (12/15 0610) Resp:  [15-33] 22 (12/15 0420) BP: (111-160)/(69-94) 160/82 (12/15 0420) SpO2:  [90 %-98 %] 97 % (12/15 0420) Weight:  [130.5 kg] 130.5 kg (12/15 0420)  Hemodynamic parameters for last 24 hours:    Intake/Output from previous day: 12/14 0701 - 12/15 0700 In: 323 [P.O.:320; I.V.:3] Out: 2125 [Urine:2125] Intake/Output this shift: No intake/output data recorded.  General appearance: alert, cooperative and no distress Heart: regular rate and rhythm Lungs: poor insp effort but mostly clear, mildly dim in bases Abdomen: benign Extremities: min edema Wound: incis(evh) ok, provena in place for sternotomy  Lab Results: Recent Labs    04/17/19 0247 04/18/19 0250  WBC 9.3 8.1  HGB 10.5* 10.5*  HCT 32.7* 32.7*  PLT 181 217   BMET:  Recent Labs    04/17/19 0247 04/18/19 0250  NA 139 141  K 3.4* 4.1  CL 103 105  CO2 28 26  GLUCOSE 191* 137*  BUN 15 16  CREATININE 1.13* 1.11*  CALCIUM 7.5* 7.9*    PT/INR: No results for input(s): LABPROT, INR in the last 72 hours. ABG    Component Value Date/Time   PHART 7.345 (L) 04/15/2019 0323   HCO3 20.3 04/15/2019 0323   TCO2 21 (L) 04/15/2019 0323   ACIDBASEDEF 5.0 (H) 04/15/2019 0323   O2SAT 95.0 04/15/2019 0323   CBG (last 3)  Recent Labs   04/17/19 1559 04/17/19 2136 04/18/19 0611  GLUCAP 136* 150* 136*    Meds Scheduled Meds: . aspirin EC  325 mg Oral Daily  . atorvastatin  80 mg Oral q1800  . Chlorhexidine Gluconate Cloth  6 each Topical Daily  . docusate sodium  200 mg Oral Daily  . enoxaparin (LOVENOX) injection  50 mg Subcutaneous QHS  . furosemide  40 mg Oral Daily  . insulin aspart  0-24 Units Subcutaneous TID AC & HS  . insulin detemir  10 Units Subcutaneous Daily  . metoprolol tartrate  12.5 mg Oral BID  . mupirocin ointment  1 application Nasal BID  . potassium chloride  20 mEq Oral Daily  . sodium chloride flush  3 mL Intravenous Q12H  . vitamin B-12  1,000 mcg Oral Daily   Continuous Infusions: . sodium chloride     PRN Meds:.sodium chloride, acetaminophen, alum & mag hydroxide-simeth, bisacodyl **OR** bisacodyl, ondansetron **OR** ondansetron (ZOFRAN) IV, oxyCODONE, pneumococcal 23 valent vaccine, sodium chloride flush, traMADol  Xrays DG Chest Port 1 View In am  Result Date: 04/17/2019 CLINICAL DATA:  Chest following CABG EXAM: PORTABLE CHEST 1 VIEW COMPARISON:  04/16/2019 FINDINGS: Previous median sternotomy and CABG. Cardiomegaly as seen previously. Mild venous hypertension and mild areas of linear atelectasis. No significant change. No lobar collapse. Left chest tube is been removed. No visible pneumothorax. IMPRESSION:  Left chest tube has been removed. No pneumothorax. Venous hypertension and areas of linear atelectasis persist. Electronically Signed   By: Nelson Chimes M.D.   On: 04/17/2019 08:08    Assessment/Plan: S/P Procedure(s) (LRB): CORONARY ARTERY BYPASS GRAFTING (CABG)x4. Left intermal mammary take down and endovein harvest [name] greater saphenous vein. LIMA to LAD. Vein graft to RPD, OM with sequential to distal circ. (N/A) TRANSESOPHAGEAL ECHOCARDIOGRAM (TEE) (N/A) Application Of Wound Vac Sternal Closure using KLS Sternal Plating Set. (Left)  1 conts to do well overall 2 some  HTN, will starte ACE-I 3 CXR some atx, small effus, stas good on RA 4 will d/c insulin and start metformin, have nursing teach BS check 5 H/H stable 6 renal fxn improving trend 7 Poss home 1-2 days  LOS: 5 days    Michele Giovanni PA-C 04/18/2019 Pager [phone]- not for patient use  I have seen and examined Michele [name] and agree with the above assessment  and plan.  Grace Isaac MD Beeper [phone] Office [phone] 04/18/2019 1:26 PM

## 2019-04-18 NOTE — Discharge Instructions (Signed)
Coronary Artery Bypass Grafting, Care After °This sheet gives you information about how to care for yourself after your procedure. Your doctor may also give you more specific instructions. If you have problems or questions, call your doctor. °What can I expect after the procedure? °After the procedure, it is common to: °· Feel sick to your stomach (nauseous). °· Not want to eat as much as normal (lack of appetite). °· Have trouble pooping (constipation). °· Have weakness and tiredness (fatigue). °· Feel sad (depressed) or grouchy (irritable). °· Have pain or discomfort around the cuts from surgery (incisions). °Follow these instructions at home: °Medicines °· Take over-the-counter and prescription medicines only as told by your doctor. Do not stop taking medicines or start any new medicines unless your doctor says it is okay. °· If you were prescribed an antibiotic medicine, take it as told by your doctor. Do not stop taking the antibiotic even if you start to feel better. °Incision care ° °· Follow instructions from your doctor about how to take care of your cuts from surgery. Make sure you: °? Wash your hands with soap and water before and after you change your bandage (dressing). If you cannot use soap and water, use hand sanitizer. °? Change your bandage as told by your doctor. °? Leave stitches (sutures), skin glue, or skin tape (adhesive) strips in place. They may need to stay in place for 2 weeks or longer. If tape strips get loose and curl up, you may trim the loose edges. Do not remove tape strips completely unless your doctor says it is okay. °· Make sure the surgery cuts are clean, dry, and protected. °· Check your cut areas every day for signs of infection. Check for: °? More redness, swelling, or pain. °? More fluid or blood. °? Warmth. °? Pus or a bad smell. °· If cuts were made in your legs: °? Avoid crossing your legs. °? Avoid sitting for long periods of time. Change positions every 30  minutes. °? Raise (elevate) your legs when you are sitting. °Bathing °· Do not take baths, swim, or use a hot tub until your doctor says it is okay. °· You may take a shower. Pat the surgery cuts dry. Do not rub the cuts to dry. °Eating and drinking ° °· Eat foods that are high in fiber, such as beans, nuts, whole grains, and raw fruits and vegetables. Any meats you eat should be lean cut. Avoid canned, processed, and fried foods. This can help prevent trouble pooping. This is also a part of a heart-healthy diet. °· Drink enough fluid to keep your pee (urine) pale yellow. °· Do not drink alcohol until you are fully recovered. Ask your doctor when it is safe to drink alcohol. °Activity °· Rest and limit your activity as told by your doctor. You may be told to: °? Stop any activity right away if you have chest pain, shortness of breath, irregular heartbeats, or dizziness. Get help right away if you have any of these symptoms. °? Move around often for short periods or take short walks as told by your doctor. Slowly increase your activities. °? Avoid lifting, pushing, or pulling anything that is heavier than 10 lb (4.5 kg) for at least 6 weeks or as told by your doctor. °· Do physical therapy or a cardiac rehab (cardiac rehabilitation) program as told by your doctor. °? Physical therapy involves doing exercises to maintain movement and build strength and endurance. °? A cardiac rehab program includes: °§   Exercise training. °§ Education. °§ Counseling. °· Do not drive until your doctor says it is okay. °· Ask your doctor when you can go back to work. °· Ask your doctor when you can be sexually active. °General instructions °· Do not drive or use heavy machinery while taking prescription pain medicine. °· Do not use any products that contain nicotine or tobacco. These include cigarettes, e-cigarettes, and chewing tobacco. If you need help quitting, ask your doctor. °· Take 2-3 deep breaths every few hours during the day  while you get better. This helps expand your lungs and prevent problems. °· If you were given a device called an incentive spirometer, use it several times a day to practice deep breathing. Support your chest with a pillow or your arms when you take deep breaths or cough. °· Wear compression stockings as told by your doctor. °· Weigh yourself every day. This helps to see if your body is holding (retaining) fluid that may make your heart and lungs work harder. °· Keep all follow-up visits as told by your doctor. This is important. °Contact a doctor if: °· You have more redness, swelling, or pain around any cut. °· You have more fluid or blood coming from any cut. °· Any cut feels warm to the touch. °· You have pus or a bad smell coming from any cut. °· You have a fever. °· You have swelling in your ankles or legs. °· You have pain in your legs. °· You gain 2 lb (0.9 kg) or more a day. °· You feel sick to your stomach or you throw up (vomit). °· You have watery poop (diarrhea). °Get help right away if: °· You have chest pain that goes to your jaw or arms. °· You are short of breath. °· You have a fast or irregular heartbeat. °· You notice a "clicking" in your breastbone (sternum) when you move. °· You have any signs of a stroke. "BE FAST" is an easy way to remember the main warning signs: °? B - Balance. Signs are dizziness, sudden trouble walking, or loss of balance. °? E - Eyes. Signs are trouble seeing or a change in how you see. °? F - Face. Signs are sudden weakness or loss of feeling of the face, or the face or eyelid drooping on one side. °? A - Arms. Signs are weakness or loss of feeling in an arm. This happens suddenly and usually on one side of the body. °? S - Speech. Signs are sudden trouble speaking, slurred speech, or trouble understanding what people say. °? T - Time. Time to call emergency services. Write down what time symptoms started. °· You have other signs of a stroke, such as: °? A sudden, very  bad headache with no known cause. °? Feeling sick to your stomach. °? Throwing up. °? Jerky movements you cannot control (seizure). °These symptoms may be an emergency. Do not wait to see if the symptoms will go away. Get medical help right away. Call your local emergency services (911 in the [place] Do not drive yourself to the hospital. °Summary °· After the procedure, it is common to have pain or discomfort in the cuts from surgery (incisions). °· Do not take baths, swim, or use a hot tub until your doctor says it is okay. °· Slowly increase your activities. You may need physical therapy or cardiac rehab. °· Weigh yourself every day. This helps to see if your body is holding fluid. °This information is not   intended to replace advice given to you by your health care provider. Make sure you discuss any questions you have with your health care provider. °Document Released: 04/25/2013 Document Revised: 12/28/2017 Document Reviewed: 12/28/2017 °Elsevier Patient Education © 2020 [employer] ° °Endoscopic Saphenous Vein Harvesting, Care After °This sheet gives you information about how to care for yourself after your procedure. Your health care provider may also give you more specific instructions. If you have problems or questions, contact your health care provider. °What can I expect after the procedure? °After the procedure, it is common to have: °· Pain. °· Bruising. °· Swelling. °· Numbness. °Follow these instructions at home: °Incision care ° °· Follow instructions from your health care provider about how to take care of your incisions. Make sure you: °? Wash your hands with soap and water before and after you change your bandages (dressings). If soap and water are not available, use hand sanitizer. °? Change your dressings as told by your health care provider. °? Leave stitches (sutures), skin glue, or adhesive strips in place. These skin closures may need to stay in place for 2 weeks or longer. If adhesive  strip edges start to loosen and curl up, you may trim the loose edges. Do not remove adhesive strips completely unless your health care provider tells you to do that. °· Check your incision areas every day for signs of infection. Check for: °? More redness, swelling, or pain. °? Fluid or blood. °? Warmth. °? Pus or a bad smell. °Medicines °· Take over-the-counter and prescription medicines only as told by your health care provider. °· Ask your health care provider if the medicine prescribed to you requires you to avoid driving or using heavy machinery. °General instructions °· Raise (elevate) your legs above the level of your heart while you are sitting or lying down. °· Avoid crossing your legs. °· Avoid sitting for long periods of time. Change positions every 30 minutes. °· Do any exercises your health care providers have given you. These may include deep breathing, coughing, and walking exercises. °· Do not take baths, swim, or use a hot tub until your health care provider approves. Ask your health care provider if you may take showers. You may only be allowed to take sponge baths. °· Wear compression stockings as told by your health care provider. These stockings help to prevent blood clots and reduce swelling in your legs. °· Keep all follow-up visits as told by your health care provider. This is important. °Contact a health care provider if: °· Medicine does not help your pain. °· Your pain gets worse. °· You have new leg bruises or your leg bruises get bigger. °· Your leg feels numb. °· You have more redness, swelling, or pain around your incision. °· You have fluid or blood coming from your incision. °· Your incision feels warm to the touch. °· You have pus or a bad smell coming from your incision. °· You have a fever. °Get help right away if: °· Your pain is severe. °· You develop pain, tenderness, warmth, redness, or swelling in any part of your leg. °· You have chest pain. °· You have trouble  breathing. °Summary °· Raise (elevate) your legs above the level of your heart while you are sitting or lying down. °· Wear compression stockings as told by your health care provider. °· Make sure you know which symptoms should prompt you to contact your health care provider. °· Keep all follow-up visits as told by   your health care provider. °This information is not intended to replace advice given to you by your health care provider. Make sure you discuss any questions you have with your health care provider. °Document Released: 12/31/2010 Document Revised: 03/28/2018 Document Reviewed: 03/28/2018 °Elsevier Patient Education © 2020 Elsevier Inc. ° °

## 2019-04-19 LAB — GLUCOSE, CAPILLARY
Glucose-Capillary: 156 mg/dL — ABNORMAL HIGH (ref 70–99)
Glucose-Capillary: 128 mg/dL — ABNORMAL HIGH (ref 70–99)
Glucose-Capillary: 137 mg/dL — ABNORMAL HIGH (ref 70–99)
Glucose-Capillary: 93 mg/dL (ref 70–99)

## 2019-04-19 LAB — BASIC METABOLIC PANEL WITH GFR
Anion gap: 8 (ref 5–15)
BUN: 12 mg/dL (ref 6–20)
CO2: 28 mmol/L (ref 22–32)
Calcium: 8.2 mg/dL — ABNORMAL LOW (ref 8.9–10.3)
Chloride: 103 mmol/L (ref 98–111)
Creatinine, Ser: 1.12 mg/dL — ABNORMAL HIGH (ref 0.44–1.00)
GFR calc Af Amer: >60 mL/min
GFR calc non Af Amer: 54 mL/min — ABNORMAL LOW
Glucose, Bld: 124 mg/dL — ABNORMAL HIGH (ref 70–99)
Potassium: 3.7 mmol/L (ref 3.5–5.1)
Sodium: 139 mmol/L (ref 135–145)

## 2019-04-19 MED ORDER — POTASSIUM CHLORIDE CRYS ER 20 MEQ PO TBCR
20.0000 meq | EXTENDED_RELEASE_TABLET | Freq: Two times a day (BID) | ORAL | Status: DC
  Administered 2019-04-19 – 2019-04-20 (×3): 20 meq via ORAL
  Filled 2019-04-19 (×3): qty 1

## 2019-04-19 MED ORDER — METFORMIN HCL 850 MG PO TABS
850.0000 mg | ORAL_TABLET | Freq: Two times a day (BID) | ORAL | Status: DC
  Administered 2019-04-19 – 2019-04-20 (×3): 850 mg via ORAL
  Filled 2019-04-19 (×4): qty 1

## 2019-04-19 NOTE — Progress Notes (Signed)
Pacing wires removed. Pt. Tolerated well. VSS. Will continue to monitor.

## 2019-04-19 NOTE — Progress Notes (Signed)
Inpatient Diabetes Program Recommendations  AACE/ADA: New Consensus Statement on Inpatient Glycemic Control (2015)  Target Ranges:  Prepandial:   less than 140 mg/dL      Peak postprandial:   less than 180 mg/dL (1-2 hours)      Critically ill patients:  140 - 180 mg/dL   Lab Results  Component Value Date   GLUCAP 128 (H) 04/19/2019   HGBA1C 7.8 (H) 04/13/2019    Noted new consult. DM coordinator spoke with pt on 12/14 on new diagnosis education.  Attempted to speak with patient and she was not able to talk at the time. Reached out to RN and encouraged to reach back out with any concerns. Per MD notes, education is going well.   Thanks, Bronson Curb, MSN, RNC-OB Diabetes Coordinator [phone] (8a-5p)

## 2019-04-19 NOTE — Progress Notes (Signed)
   Stable from cardiac standpoint.  Tolerating the increased dose of beta-blocker.  Agree with high intensity statin therapy.  No new recommendations.

## 2019-04-19 NOTE — Progress Notes (Signed)
CARDIAC REHAB PHASE I   PRE:  Rate/Rhythm: 96 SR    BP: sitting 134/75    SaO2: 95 RA  MODE:  Ambulation: 370 ft   POST:  Rate/Rhythm: 116 ST    BP: sitting 151/73     SaO2: 95 RA  Pt up in recliner, feeling fairly well. She does have pain when she coughs and moves but not taking pain meds. Encouraged small amount of pain meds to help control pain. Needed reminders for sternal precautions and she sts this is the first she has heard precautions. Gave her Move in the Tube handout and verbal instructions. She was able to stand with rocking and walked hall with RW. Fatigue with distance, HR to 116 ST. Quite tired after walk "I didn't think I was going to make it". Return to recliner. Encouraged x2 more walks and IS. [id number]   [place], ACSM 04/19/2019 11:58 AM

## 2019-04-19 NOTE — Progress Notes (Signed)
[address]       [place] [address]             [phone]      5 Days Post-Op Procedure(s) (LRB): CORONARY ARTERY BYPASS GRAFTING (CABG)x4. Left intermal mammary take down and endovein harvest right greater saphenous vein. LIMA to LAD. Vein graft to RPD, OM with sequential to distal circ. (N/A) TRANSESOPHAGEAL ECHOCARDIOGRAM (TEE) (N/A) Application Of Wound Vac Sternal Closure using KLS Sternal Plating Set. (Left) Subjective: Did have episode of atrial tachy to 120's, brief but she did feel some palpitations  Objective: Vital signs in last 24 hours: Temp:  [97.5 F (36.4 C)-98.7 F (37.1 C)] 98.6 F (37 C) (12/16 0402) Pulse Rate:  [93-107] 93 (12/16 0402) Cardiac Rhythm: Sinus tachycardia (12/15 2000) Resp:  [18-29] 25 (12/16 0430) BP: (117-153)/(58-85) 130/70 (12/16 0402) SpO2:  [94 %-99 %] 94 % (12/16 0402) Weight:  [129 kg] 129 kg (12/16 0430)  Hemodynamic parameters for last 24 hours:    Intake/Output from previous day: 12/15 0701 - 12/16 0700 In: 280 [P.O.:280] Out: 350 [Urine:350] Intake/Output this shift: No intake/output data recorded.  General appearance: alert, cooperative and no distress Heart: regular rate and rhythm Lungs: clear to auscultation bilaterally Abdomen: benign Extremities: min edema Wound: incis healing well  Lab Results: Recent Labs    04/17/19 0247 04/18/19 0250  WBC 9.3 8.1  HGB 10.5* 10.5*  HCT 32.7* 32.7*  PLT 181 217   BMET:  Recent Labs    04/18/19 0250 04/19/19 0339  NA 141 139  K 4.1 3.7  CL 105 103  CO2 26 28  GLUCOSE 137* 124*  BUN 16 12  CREATININE 1.11* 1.12*  CALCIUM 7.9* 8.2*    PT/INR: No results for input(s): LABPROT, INR in the last 72 hours. ABG    Component Value Date/Time   PHART 7.345 (L) 04/15/2019 0323   HCO3 20.3 04/15/2019 0323   TCO2 21 (L) 04/15/2019 0323   ACIDBASEDEF 5.0 (H) 04/15/2019 0323   O2SAT 95.0 04/15/2019 0323   CBG (last 3)  Recent Labs   04/18/19 1610 04/18/19 2150 04/19/19 0617  GLUCAP 132* 191* 156*    Meds Scheduled Meds: . aspirin EC  325 mg Oral Daily  . atorvastatin  80 mg Oral q1800  . Chlorhexidine Gluconate Cloth  6 each Topical Daily  . docusate sodium  200 mg Oral Daily  . enoxaparin (LOVENOX) injection  50 mg Subcutaneous QHS  . furosemide  40 mg Oral Daily  . insulin aspart  0-24 Units Subcutaneous TID AC & HS  . lisinopril  10 mg Oral Daily  . metFORMIN  500 mg Oral BID WC  . metoprolol tartrate  25 mg Oral BID  . potassium chloride  20 mEq Oral Daily  . sodium chloride flush  3 mL Intravenous Q12H  . vitamin B-12  1,000 mcg Oral Daily   Continuous Infusions: . sodium chloride     PRN Meds:.sodium chloride, acetaminophen, alum & mag hydroxide-simeth, bisacodyl **OR** bisacodyl, ondansetron **OR** ondansetron (ZOFRAN) IV, oxyCODONE, pneumococcal 23 valent vaccine, sodium chloride flush, traMADol  Xrays DG Chest 2 View  Result Date: 04/18/2019 CLINICAL DATA:  Post CABG EXAM: CHEST - 2 VIEW COMPARISON:  04/17/2019 FINDINGS: Pulmonary vascular congestion. Patchy bandlike atelectasis bilaterally. No significant pleural effusion. No pneumothorax. Stable cardiomediastinal contours. IMPRESSION: Pulmonary vascular congestion and patchy atelectasis similar to the prior study. Electronically Signed   By: Macy Mis M.D.   On:  04/18/2019 08:13    Assessment/Plan: S/P Procedure(s) (LRB): CORONARY ARTERY BYPASS GRAFTING (CABG)x4. Left intermal mammary take down and endovein harvest right greater saphenous vein. LIMA to LAD. Vein graft to RPD, OM with sequential to distal circ. (N/A) TRANSESOPHAGEAL ECHOCARDIOGRAM (TEE) (N/A) Application Of Wound Vac Sternal Closure using KLS Sternal Plating Set. (Left)  1 overall doing well 2 cont to monitor HR/rhythm- metoprolol increased yesterday, may need to increase further, BP control better on lisinopril. Renal fxn stable, replace K+. D/C epw's today 3 BS  control is fairly good - will increase glucophage, she was educated about doing own finger sticks and is able to do . She understands she will need close f/u with primary care and diet adjustments.  4 cont routine pulm toilet and rehab  LOS: 6 days    Rowe Clack [facility] 04/19/2019 Pager [phone]- not for patient use

## 2019-04-20 LAB — GLUCOSE, CAPILLARY
Glucose-Capillary: 124 mg/dL — ABNORMAL HIGH (ref 70–99)
Glucose-Capillary: 166 mg/dL — ABNORMAL HIGH (ref 70–99)

## 2019-04-20 MED ORDER — LISINOPRIL 20 MG PO TABS: 20.0000 mg | ORAL_TABLET | Freq: Every day | ORAL | 1 refills | Status: AC

## 2019-04-20 MED ORDER — ASPIRIN 325 MG PO TBEC: 325.0000 mg | DELAYED_RELEASE_TABLET | Freq: Every day | ORAL | 0 refills | Status: DC

## 2019-04-20 MED ORDER — METOPROLOL TARTRATE 25 MG PO TABS: 25.0000 mg | ORAL_TABLET | Freq: Two times a day (BID) | ORAL | 1 refills | Status: AC

## 2019-04-20 MED ORDER — METFORMIN HCL 850 MG PO TABS: 850.0000 mg | ORAL_TABLET | Freq: Two times a day (BID) | ORAL | 1 refills | Status: DC

## 2019-04-20 MED ORDER — ATORVASTATIN CALCIUM 80 MG PO TABS: 80.0000 mg | ORAL_TABLET | Freq: Every day | ORAL | 1 refills | Status: AC

## 2019-04-20 MED ORDER — TRAMADOL HCL 50 MG PO TABS: 50.0000 mg | ORAL_TABLET | Freq: Four times a day (QID) | ORAL | 0 refills | Status: AC | PRN

## 2019-04-20 MED FILL — Lidocaine HCl Local Soln Prefilled Syringe 100 MG/5ML (2%): INTRAMUSCULAR | Qty: 5 | Status: AC

## 2019-04-20 MED FILL — Mannitol IV Soln 20%: INTRAVENOUS | Qty: 500 | Status: AC

## 2019-04-20 MED FILL — Heparin Sodium (Porcine) Inj 1000 Unit/ML: INTRAMUSCULAR | Qty: 10 | Status: AC

## 2019-04-20 MED FILL — Electrolyte-R (PH 7.4) Solution: INTRAVENOUS | Qty: 3000 | Status: AC

## 2019-04-20 MED FILL — Sodium Bicarbonate IV Soln 8.4%: INTRAVENOUS | Qty: 50 | Status: AC

## 2019-04-20 MED FILL — Sodium Chloride IV Soln 0.9%: INTRAVENOUS | Qty: 2000 | Status: AC

## 2019-04-20 NOTE — Plan of Care (Signed)
°  Problem: Clinical Measurements: °Goal: Respiratory complications will improve °Outcome: Progressing °Goal: Cardiovascular complication will be avoided °Outcome: Progressing °  °Problem: Activity: °Goal: Risk for activity intolerance will decrease °Outcome: Progressing °  °

## 2019-04-20 NOTE — Progress Notes (Signed)
   Being discharged today.  We will ensure cardiology follow-up in 2 to 3 weeks with [facility].

## 2019-04-20 NOTE — Progress Notes (Signed)
Discharge instructions given to patient. IV removed, clean and intact. Wound care and medications reviewed. All questions answered. Patient escorted home by daughter.  Gaelle Adriance R Stella Encarnacion, RN  

## 2019-04-20 NOTE — Progress Notes (Signed)
Discussed sternal precautions, IS, smoking cessation, diet (including carb counting), exercise, and CPRII. Will refer to [place] and [facility]. Interested in virtual.  Pt is interested in participating in Virtual Cardiac and Pulmonary Rehab. Pt advised that Virtual Cardiac and Pulmonary Rehab is provided at no cost to the patient.  Checklist:  1. Pt has smart device  ie smartphone and/or ipad for downloading an app  Yes 2. Reliable internet/wifi service    Yes 3. Understands how to use their smartphone and navigate within an app.  Yes  Pt verbalized understanding and is in agreement. [id number] [place], ACSM 10:17 AM 04/20/2019

## 2019-04-20 NOTE — Progress Notes (Signed)
[address]       [place] [address]             [phone]      6 Days Post-Op Procedure(s) (LRB): CORONARY ARTERY BYPASS GRAFTING (CABG)x4. Left intermal mammary take down and endovein harvest right greater saphenous vein. LIMA to LAD. Vein graft to RPD, OM with sequential to distal circ. (N/A) TRANSESOPHAGEAL ECHOCARDIOGRAM (TEE) (N/A) Application Of Wound Vac Sternal Closure using KLS Sternal Plating Set. (Left) Subjective: Feels good, rhythm is stable  Objective: Vital signs in last 24 hours: Temp:  [98.3 F (36.8 C)-99.3 F (37.4 C)] 99 F (37.2 C) (12/17 0349) Pulse Rate:  [74-97] 88 (12/17 0349) Cardiac Rhythm: Normal sinus rhythm (12/16 1900) Resp:  [15-22] 21 (12/17 0427) BP: (122-162)/(70-88) 155/88 (12/17 0349) SpO2:  [93 %-96 %] 95 % (12/17 0349) Weight:  [127.4 kg] 127.4 kg (12/17 0427)  Hemodynamic parameters for last 24 hours:    Intake/Output from previous day: 12/16 0701 - 12/17 0700 In: 360 [P.O.:360] Out: 427 [Urine:425; Stool:2] Intake/Output this shift: No intake/output data recorded.  General appearance: alert, cooperative and no distress Heart: regular rate and rhythm Lungs: clear to auscultation bilaterally Abdomen: benign Extremities: min edema Wound: incis healing well  Lab Results: Recent Labs    04/18/19 0250  WBC 8.1  HGB 10.5*  HCT 32.7*  PLT 217   BMET:  Recent Labs    04/18/19 0250 04/19/19 0339  NA 141 139  K 4.1 3.7  CL 105 103  CO2 26 28  GLUCOSE 137* 124*  BUN 16 12  CREATININE 1.11* 1.12*  CALCIUM 7.9* 8.2*    PT/INR: No results for input(s): LABPROT, INR in the last 72 hours. ABG    Component Value Date/Time   PHART 7.345 (L) 04/15/2019 0323   HCO3 20.3 04/15/2019 0323   TCO2 21 (L) 04/15/2019 0323   ACIDBASEDEF 5.0 (H) 04/15/2019 0323   O2SAT 95.0 04/15/2019 0323   CBG (last 3)  Recent Labs    04/19/19 1632 04/19/19 2117 04/20/19 0606  GLUCAP 137* 93 124*     Meds Scheduled Meds: . aspirin EC  325 mg Oral Daily  . atorvastatin  80 mg Oral q1800  . Chlorhexidine Gluconate Cloth  6 each Topical Daily  . docusate sodium  200 mg Oral Daily  . enoxaparin (LOVENOX) injection  50 mg Subcutaneous QHS  . furosemide  40 mg Oral Daily  . insulin aspart  0-24 Units Subcutaneous TID AC & HS  . lisinopril  10 mg Oral Daily  . metFORMIN  850 mg Oral BID WC  . metoprolol tartrate  25 mg Oral BID  . potassium chloride  20 mEq Oral BID  . sodium chloride flush  3 mL Intravenous Q12H  . vitamin B-12  1,000 mcg Oral Daily   Continuous Infusions: . sodium chloride     PRN Meds:.sodium chloride, acetaminophen, alum & mag hydroxide-simeth, bisacodyl **OR** bisacodyl, ondansetron **OR** ondansetron (ZOFRAN) IV, oxyCODONE, pneumococcal 23 valent vaccine, sodium chloride flush, traMADol  Xrays DG Chest 2 View  Result Date: 04/18/2019 CLINICAL DATA:  Post CABG EXAM: CHEST - 2 VIEW COMPARISON:  04/17/2019 FINDINGS: Pulmonary vascular congestion. Patchy bandlike atelectasis bilaterally. No significant pleural effusion. No pneumothorax. Stable cardiomediastinal contours. IMPRESSION: Pulmonary vascular congestion and patchy atelectasis similar to the prior study. Electronically Signed   By: Guadlupe Spanish M.D.   On: 04/18/2019 08:13    Assessment/Plan: S/P Procedure(s) (LRB): CORONARY ARTERY BYPASS  GRAFTING (CABG)x4. Left intermal mammary take down and endovein harvest right greater saphenous vein. LIMA to LAD. Vein graft to RPD, OM with sequential to distal circ. (N/A) TRANSESOPHAGEAL ECHOCARDIOGRAM (TEE) (N/A) Application Of Wound Vac Sternal Closure using KLS Sternal Plating Set. (Left)  1 doing well 2 some HTN increasee- will increase lisinopril 3 sats good on RA 4 BS well controlled- cont glucophage 5 stable for d/c  LOS: 7 days    John Giovanni PA-C 04/20/2019 Pager [phone]- not for patient use

## 2019-04-21 ENCOUNTER — Encounter: Payer: Self-pay | Admitting: Family Medicine

## 2019-04-24 ENCOUNTER — Other Ambulatory Visit: Payer: Self-pay

## 2019-04-24 DIAGNOSIS — R739 Hyperglycemia, unspecified: Secondary | ICD-10-CM

## 2019-04-24 DIAGNOSIS — I2692 Saddle embolus of pulmonary artery without acute cor pulmonale: Secondary | ICD-10-CM | POA: Insufficient documentation

## 2019-04-24 MED ORDER — BLOOD GLUCOSE MONITOR KIT: PACK | 0 refills | Status: AC

## 2019-04-26 ENCOUNTER — Telehealth (HOSPITAL_COMMUNITY): Payer: Self-pay

## 2019-04-26 NOTE — Telephone Encounter (Signed)
Attempted to contact pt in regards to CR, voicemail full. Unable to leave VM.

## 2019-05-02 ENCOUNTER — Telehealth: Payer: Self-pay | Admitting: *Deleted

## 2019-05-02 ENCOUNTER — Ambulatory Visit: Payer: Managed Care, Other (non HMO) | Admitting: Physician Assistant

## 2019-05-02 NOTE — Telephone Encounter (Signed)
Michele [name] daughter, Griffin Dakin, called our office to inform Dr. Servando Snare of her mother's medical status.  She had to be rushed to [facility].  She is in the ICU with a saddle pulmonary embolus.  She anticipates she will be there at least two more weeks.  I thanked her for the update.

## 2019-05-03 ENCOUNTER — Ambulatory Visit: Payer: Managed Care, Other (non HMO) | Admitting: Physician Assistant

## 2019-05-03 MED ORDER — GLUCOSE 40 % PO GEL
15.00 | ORAL | Status: DC
Start: ? — End: 2019-05-03

## 2019-05-03 MED ORDER — DEXTROSE 10 % IV SOLN
125.00 | INTRAVENOUS | Status: DC
Start: ? — End: 2019-05-03

## 2019-05-03 MED ORDER — ONDANSETRON HCL 4 MG/2ML IJ SOLN
4.00 | INTRAMUSCULAR | Status: DC
Start: ? — End: 2019-05-03

## 2019-05-03 MED ORDER — INSULIN LISPRO 100 UNIT/ML ~~LOC~~ SOLN
2.00 | SUBCUTANEOUS | Status: DC
Start: 2019-05-03 — End: 2019-05-03

## 2019-05-03 MED ORDER — LISINOPRIL 20 MG PO TABS
20.00 | ORAL_TABLET | ORAL | Status: DC
Start: 2019-05-04 — End: 2019-05-03

## 2019-05-03 MED ORDER — PERFLUTREN PROTEIN A MICROSPH IV SUSP
0.50 | INTRAVENOUS | Status: DC
Start: ? — End: 2019-05-03

## 2019-05-03 MED ORDER — ACETAMINOPHEN 325 MG PO TABS
650.00 | ORAL_TABLET | ORAL | Status: DC
Start: ? — End: 2019-05-03

## 2019-05-03 MED ORDER — GENERIC EXTERNAL MEDICATION
0.05 | Status: DC
Start: ? — End: 2019-05-03

## 2019-05-03 MED ORDER — METOPROLOL TARTRATE 25 MG PO TABS
25.00 | ORAL_TABLET | ORAL | Status: DC
Start: 2019-05-03 — End: 2019-05-03

## 2019-05-03 MED ORDER — ASPIRIN 81 MG PO TBEC
81.00 | DELAYED_RELEASE_TABLET | ORAL | Status: DC
Start: 2019-05-04 — End: 2019-05-03

## 2019-05-03 MED ORDER — GENERIC EXTERNAL MEDICATION
2.50 | Status: DC
Start: ? — End: 2019-05-03

## 2019-05-03 MED ORDER — ATORVASTATIN CALCIUM 40 MG PO TABS
80.00 | ORAL_TABLET | ORAL | Status: DC
Start: 2019-05-03 — End: 2019-05-03

## 2019-05-11 ENCOUNTER — Encounter: Payer: Managed Care, Other (non HMO) | Admitting: Cardiothoracic Surgery

## 2019-05-11 DIAGNOSIS — D7582 Heparin induced thrombocytopenia (HIT): Secondary | ICD-10-CM | POA: Insufficient documentation

## 2019-05-11 DIAGNOSIS — D75829 Heparin-induced thrombocytopenia, unspecified: Secondary | ICD-10-CM | POA: Insufficient documentation

## 2019-05-11 DIAGNOSIS — I2510 Atherosclerotic heart disease of native coronary artery without angina pectoris: Secondary | ICD-10-CM | POA: Insufficient documentation

## 2019-06-07 ENCOUNTER — Other Ambulatory Visit: Payer: Self-pay | Admitting: Cardiothoracic Surgery

## 2019-06-07 DIAGNOSIS — Z951 Presence of aortocoronary bypass graft: Secondary | ICD-10-CM

## 2019-06-08 ENCOUNTER — Encounter: Payer: Self-pay | Admitting: Cardiothoracic Surgery

## 2019-06-08 NOTE — Progress Notes (Deleted)
[address]       [place] [address]             [phone]      Sylwia R Bluestone  Medical Record #[id number] Date of Birth: Nov 20, 1960  Referring: Burnell Blanks* Primary Care: No primary care provider on file. Primary Cardiologist: Lauree Chandler, MD   Chief Complaint:   POST OP FOLLOW UP OPERATIVE REPORT DATE OF PROCEDURE:  04/14/2019 PREOPERATIVE DIAGNOSES:  Coronary occlusive disease with unstable angina; recent non-ST elevation myocardial infarction. POSTOPERATIVE DIAGNOSES:  Coronary occlusive disease with unstable angina; recent non-ST elevation myocardial infarction. SURGICAL PROCEDURE:  Coronary artery bypass grafting x4 with the left internal mammary to the left anterior descending coronary artery, reverse saphenous vein graft sequentially to the first obtuse marginal and distal circumflex, reverse saphenous vein  graft to the posterior descending coronary artery with right thigh and calf greater saphenous endoscopic vein harvesting, plating of left side of sternum with KLS plating system.  History of Present Illness:          Past Medical History:  Diagnosis Date  . Fibromyalgia      Social History   Tobacco Use  Smoking Status Current Every Day Smoker  Smokeless Tobacco Never Used    Social History   Substance and Sexual Activity  Alcohol Use None     No Known Allergies  Current Outpatient Medications  Medication Sig Dispense Refill  . aspirin EC 325 MG EC tablet Take 1 tablet (325 mg total) by mouth daily. 30 tablet 0  . atorvastatin (LIPITOR) 80 MG tablet Take 1 tablet (80 mg total) by mouth daily at 6 PM. 30 tablet 1  . blood glucose meter kit and supplies KIT Dispense based on patient and insurance preference. Use up to four times daily as directed. (FOR ICD-9 250.00, 250.01). 1 each 0  . cholecalciferol (VITAMIN D3) 25 MCG (1000 UT) tablet Take 4,000 Units by mouth daily.    Marland Kitchen lisinopril  (ZESTRIL) 20 MG tablet Take 1 tablet (20 mg total) by mouth daily. 30 tablet 1  . metFORMIN (GLUCOPHAGE) 850 MG tablet Take 1 tablet (850 mg total) by mouth 2 (two) times daily with a meal. 60 tablet 1  . metoprolol tartrate (LOPRESSOR) 25 MG tablet Take 1 tablet (25 mg total) by mouth 2 (two) times daily. 60 tablet 1  . vitamin B-12 (CYANOCOBALAMIN) 1000 MCG tablet Take 1,000 mcg by mouth daily.     No current facility-administered medications for this visit.       Physical Exam: There were no vitals taken for this visit.  {Physical UXLK:4401027}   Diagnostic Studies & Laboratory data:     Recent Radiology Findings:   No results found.    Recent Lab Findings: Lab Results  Component Value Date   WBC 8.1 04/18/2019   HGB 10.5 (L) 04/18/2019   HCT 32.7 (L) 04/18/2019   PLT 217 04/18/2019   GLUCOSE 124 (H) 04/19/2019   CHOL 209 (H) 04/13/2019   TRIG 274 (H) 04/13/2019   HDL 30 (L) 04/13/2019   LDLCALC 124 (H) 04/13/2019   ALT 26 04/13/2019   AST 19 04/13/2019   NA 139 04/19/2019   K 3.7 04/19/2019   CL 103 04/19/2019   CREATININE 1.12 (H) 04/19/2019   BUN 12 04/19/2019   CO2 28 04/19/2019   TSH 3.269 03/02/2008   INR 1.5 (H) 04/14/2019   HGBA1C 7.8 (H) 04/13/2019  Assessment / Plan:        Medication Changes: No orders of the defined types were placed in this encounter.     Grace Isaac MD      [address] [place] [address] Office [phone]     06/08/2019 10:42 AM

## 2019-06-14 NOTE — Progress Notes (Signed)
[address]       [place] [address]             [phone]      Michele [name] [place] Medical Record #[id number] Date of Birth: Jun 27, 1960  Referring: Burnell Blanks* Primary Care: Patient, No Pcp Per Primary Cardiologist: Lauree Chandler, MD   Chief Complaint:   POST OP FOLLOW UP OPERATIVE REPORT DATE OF PROCEDURE:  04/14/2019 PREOPERATIVE DIAGNOSES:  Coronary occlusive disease with unstable angina; recent non-ST elevation myocardial infarction. POSTOPERATIVE DIAGNOSES:  Coronary occlusive disease with unstable angina; recent non-ST elevation myocardial infarction. SURGICAL PROCEDURE:  Coronary artery bypass grafting x4 with the left internal mammary to the left anterior descending coronary artery, reverse saphenous vein graft sequentially to the first obtuse marginal and distal circumflex, reverse saphenous vein  graft to the posterior descending coronary artery with right thigh and calf greater saphenous endoscopic vein harvesting, plating of left side of sternum with KLS plating system.  History of Present Illness:     Patient underwent urgent coronary artery bypass grafting following non-ST elevated myocardial infarction on April 14, 2019.  Approximately 5 to 6 days after discharge home she presented to [facility] which is very close to her home with severe shortness of breath.  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  She required a second admission after which she was discharged home on Coumadin and had an INR of 10 . Since discharge home she has continued to improve remaining on anticoagulation.  Ambulating with some use of a walker.     Past Medical History:  Diagnosis Date  . Fibromyalgia      Social History   Tobacco Use  Smoking Status Current Every Day Smoker  Smokeless Tobacco Never Used      Social History   Substance and Sexual Activity  Alcohol Use None     Allergies  Allergen Reactions  . Heparin Other (See Comments)    Causes platelets to drop dangerously low    Current Outpatient Medications  Medication Sig Dispense Refill  . aspirin EC 325 MG EC tablet Take 1 tablet (325 mg total) by mouth daily. 30 tablet 0  . atorvastatin (LIPITOR) 80 MG tablet Take 1 tablet (80 mg total) by mouth daily at 6 PM. 30 tablet 1  . blood glucose meter kit and supplies KIT Dispense based on patient and insurance preference. Use up to four times daily as directed. (FOR ICD-9 250.00, 250.01). 1 each 0  . cholecalciferol (VITAMIN D3) 25 MCG (1000 UT) tablet Take 4,000 Units by mouth daily.    . famotidine (PEPCID) 10 MG tablet Take 10 mg by mouth daily.    . ferrous sulfate 325 (65 FE) MG tablet Take 325 mg by mouth daily with breakfast.    . isosorbide dinitrate (ISORDIL) 30 MG tablet Take 30 mg by mouth daily.    Marland Kitchen lisinopril (ZESTRIL) 20 MG tablet Take 1 tablet (20 mg total) by mouth daily. 30 tablet 1  . metFORMIN (GLUCOPHAGE-XR) 500 MG 24 hr tablet Take 500 mg by mouth daily with breakfast.    . metoprolol tartrate (LOPRESSOR) 25 MG tablet Take 1 tablet (25 mg total) by mouth 2 (two) times daily. 60 tablet 1  . nitroGLYCERIN (NITROSTAT) 0.4 MG SL tablet Place 0.4 mg under the tongue every 5 (five) minutes as needed for chest pain.    Marland Kitchen  pantoprazole (PROTONIX) 40 MG tablet Take 40 mg by mouth 2 (two) times daily.     . vitamin B-12 (CYANOCOBALAMIN) 1000 MCG tablet Take 1,000 mcg by mouth daily.    Marland Kitchen warfarin (COUMADIN) 1 MG tablet Take 2 mg by mouth one time only at 6 PM. 1.5 mg on Wednesdays     No current facility-administered medications for this visit.       Physical Exam: BP 113/74 (BP Location: Left Arm, Patient Position: Sitting, Cuff Size: Large)   Pulse 96   Temp 97.7 F (36.5 C) (Temporal)   Resp (!) 24   Ht 5' 4"  (1.626 m)   Wt 255 lb (115.7 kg)   SpO2 95%  Comment: RA  BMI 43.77 kg/m   General appearance: alert, cooperative and no distress Neurologic: intact Heart: regular rate and rhythm, S1, S2 normal, no murmur, click, rub or gallop Lungs: clear to auscultation bilaterally Abdomen: soft, non-tender; bowel sounds normal; no masses,  no organomegaly Extremities: extremities normal, atraumatic, no cyanosis or edema and Homans sign is negative, no sign of DVT Wound: Sternal wound and leg incisions are well-healed   Diagnostic Studies & Laboratory data:     Recent Radiology Findings:  DG Chest 2 View  Result Date: 06/15/2019 CLINICAL DATA:  Recent coronary artery bypass grafting EXAM: CHEST - 2 VIEW COMPARISON:  April 18, 2019 FINDINGS: There is bibasilar atelectasis with a minimal left pleural effusion. The lungs elsewhere are clear. Heart size and pulmonary vascularity are normal. Patient is status post coronary artery bypass grafting. There is aortic atherosclerosis. No adenopathy. There is degenerative change in the thoracic spine. No evident pneumothorax. IMPRESSION: Bibasilar atelectasis with minimal left pleural effusion. Lungs elsewhere clear. Heart size normal. Status post coronary artery bypass grafting. Aortic Atherosclerosis (ICD10-I70.0). Electronically Signed   By: Lowella Grip III M.D.   On: 06/15/2019 15:38   I have independently reviewed the above radiology studies  and reviewed the findings with the patient.  Recent Lab Findings: Lab Results  Component Value Date   WBC 8.1 04/18/2019   HGB 10.5 (L) 04/18/2019   HCT 32.7 (L) 04/18/2019   PLT 217 04/18/2019   GLUCOSE 124 (H) 04/19/2019   CHOL 209 (H) 04/13/2019   TRIG 274 (H) 04/13/2019   HDL 30 (L) 04/13/2019   LDLCALC 124 (H) 04/13/2019   ALT 26 04/13/2019   AST 19 04/13/2019   NA 139 04/19/2019   K 3.7 04/19/2019   CL 103 04/19/2019   CREATININE 1.12 (H) 04/19/2019   BUN 12 04/19/2019   CO2 28 04/19/2019   TSH 3.269 03/02/2008   INR 1.5 (H) 04/14/2019    HGBA1C 7.8 (H) 04/13/2019      Assessment / Plan:   #1 status post acute myocardial infarction and subsequent coronary artery bypass grafting-she is making reasonable progress following bypass surgery especially considering her development of pulmonary embolus after discharge.  She notes that she lives in [place] and will continue to be followed by cardiology through [facility]. #2 status post saddle pulmonary embolus-now on Coumadin anticoagulation  I have encouraged return role in the cardiac rehab program-she will investigate this as an option at [facility] as it is closer to her home.  We will plan to see her back in follow-up visit in 1 month   Medication Changes: No orders of the defined types were placed in this encounter.     Grace Isaac MD      [address] [place] [address]  Office [phone]     06/18/2019 6:12 PM

## 2019-06-15 ENCOUNTER — Ambulatory Visit
Admission: RE | Admit: 2019-06-15 | Discharge: 2019-06-15 | Disposition: A | Discharge status: Home / Self Care | Payer: Managed Care, Other (non HMO) | Source: Ambulatory Visit | Attending: Cardiothoracic Surgery | Admitting: Cardiothoracic Surgery

## 2019-06-15 ENCOUNTER — Other Ambulatory Visit: Payer: Self-pay

## 2019-06-15 ENCOUNTER — Ambulatory Visit (INDEPENDENT_AMBULATORY_CARE_PROVIDER_SITE_OTHER): Payer: Self-pay | Admitting: Cardiothoracic Surgery

## 2019-06-15 VITALS — BP 113/74 | HR 96 | Temp 97.7°F | Resp 24 | Ht 64.0 in | Wt 255.0 lb

## 2019-06-15 DIAGNOSIS — Z86711 Personal history of pulmonary embolism: Secondary | ICD-10-CM

## 2019-06-15 DIAGNOSIS — Z951 Presence of aortocoronary bypass graft: Secondary | ICD-10-CM

## 2019-06-16 ENCOUNTER — Encounter: Payer: Self-pay | Admitting: Cardiothoracic Surgery

## 2019-06-18 ENCOUNTER — Other Ambulatory Visit: Payer: Self-pay | Admitting: Surgical

## 2019-06-18 DIAGNOSIS — I10 Essential (primary) hypertension: Secondary | ICD-10-CM | POA: Insufficient documentation

## 2019-06-18 DIAGNOSIS — F329 Major depressive disorder, single episode, unspecified: Secondary | ICD-10-CM | POA: Insufficient documentation

## 2019-06-18 DIAGNOSIS — F32A Depression, unspecified: Secondary | ICD-10-CM | POA: Insufficient documentation

## 2019-06-18 DIAGNOSIS — K219 Gastro-esophageal reflux disease without esophagitis: Secondary | ICD-10-CM | POA: Insufficient documentation

## 2019-07-12 NOTE — Progress Notes (Signed)
[address]       [place] [address]             [phone]      Jalaine R Lineberry [place] Medical Record #[id number] Date of Birth: Dec 04, 1960  Referring: Burnell Blanks* Primary Care: Patient, No Pcp Per Primary Cardiologist: Lauree Chandler, MD   Chief Complaint:   POST OP FOLLOW UP OPERATIVE REPORT DATE OF PROCEDURE:  04/14/2019 PREOPERATIVE DIAGNOSES:  Coronary occlusive disease with unstable angina; recent non-ST elevation myocardial infarction. POSTOPERATIVE DIAGNOSES:  Coronary occlusive disease with unstable angina; recent non-ST elevation myocardial infarction. SURGICAL PROCEDURE:  Coronary artery bypass grafting x4 with the left internal mammary to the left anterior descending coronary artery, reverse saphenous vein graft sequentially to the first obtuse marginal and distal circumflex, reverse saphenous vein  graft to the posterior descending coronary artery with right thigh and calf greater saphenous endoscopic vein harvesting, plating of left side of sternum with KLS plating system.  History of Present Illness:     Patient underwent urgent coronary artery bypass grafting following non-ST elevated myocardial infarction on April 14, 2019.  Approximately 5 to 6 days after discharge home she presented to [facility] which is very close to her home with severe shortness of breath.  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  She required a second admission after which she was discharged home on Coumadin and had an INR of 10 . Since set the patient has continued to increase her physical activity, she still uses a walker to go significant distances.  She has been unable to enroll in any cardiac rehab program close to her home. She notes that watching her diet and manipulation of her Coumadin dosing her last 2 INRs have been  2.1 She has cardiology follow-up at [facility].   Past Medical History:  Diagnosis Date  . Fibromyalgia      Social History   Tobacco Use  Smoking Status Current Every Day Smoker  Smokeless Tobacco Never Used    Social History   Substance and Sexual Activity  Alcohol Use None     Allergies  Allergen Reactions  . Heparin Other (See Comments)    Causes platelets to drop dangerously low    Current Outpatient Medications  Medication Sig Dispense Refill  . aspirin EC 325 MG EC tablet Take 1 tablet (325 mg total) by mouth daily. 30 tablet 0  . atorvastatin (LIPITOR) 80 MG tablet Take 1 tablet (80 mg total) by mouth daily at 6 PM. 30 tablet 1  . blood glucose meter kit and supplies KIT Dispense based on patient and insurance preference. Use up to four times daily as directed. (FOR ICD-9 250.00, 250.01). 1 each 0  . cholecalciferol (VITAMIN D3) 25 MCG (1000 UT) tablet Take 4,000 Units by mouth daily.    . Colchicine 0.6 MG CAPS Take 1 capsule by mouth 2 (two) times daily.    . famotidine (PEPCID) 10 MG tablet Take 10 mg by mouth daily.    . ferrous sulfate 325 (65 FE) MG tablet Take 325 mg by mouth daily with breakfast.    . isosorbide dinitrate (ISORDIL) 30 MG tablet Take 30 mg by mouth daily.    Marland Kitchen lisinopril (ZESTRIL) 20 MG tablet Take 1 tablet (20 mg total) by mouth daily. 30 tablet 1  . metFORMIN (GLUCOPHAGE-XR) 500 MG 24 hr tablet Take 500  mg by mouth daily with breakfast.    . metoprolol tartrate (LOPRESSOR) 25 MG tablet Take 1 tablet (25 mg total) by mouth 2 (two) times daily. 60 tablet 1  . nitroGLYCERIN (NITROSTAT) 0.4 MG SL tablet Place 0.4 mg under the tongue every 5 (five) minutes as needed for chest pain.    . pantoprazole (PROTONIX) 40 MG tablet Take 40 mg by mouth 2 (two) times daily.     . vitamin B-12 (CYANOCOBALAMIN) 1000 MCG tablet Take 1,000 mcg by mouth daily.    Marland Kitchen warfarin (COUMADIN) 1 MG tablet Take 3 mg by mouth one time only at 6 PM.      No current  facility-administered medications for this visit.    Patient is currently on 81 mg of aspirin 324    Physical Exam: BP (!) 142/83 (BP Location: Right Arm, Patient Position: Sitting, Cuff Size: Large)   Pulse (!) 103   Temp (!) 97.5 F (36.4 C) (Temporal)   Resp (!) 24   Ht 5' 4.5" (1.638 m)   Wt 264 lb (119.7 kg)   SpO2 97% Comment: RA  BMI 44.62 kg/m   General appearance: alert, cooperative and no distress Neck: no adenopathy, no carotid bruit, no JVD, supple, symmetrical, trachea midline and thyroid not enlarged, symmetric, no tenderness/mass/nodules Resp: clear to auscultation bilaterally Cardio: regular rate and rhythm, S1, S2 normal, no murmur, click, rub or gallop Extremities: extremities normal, atraumatic, no cyanosis or edema Neurologic: Grossly normal  Diagnostic Studies & Laboratory data:     Recent Radiology Findings:  No results found.  Recent Lab Findings: Lab Results  Component Value Date   WBC 8.1 04/18/2019   HGB 10.5 (L) 04/18/2019   HCT 32.7 (L) 04/18/2019   PLT 217 04/18/2019   GLUCOSE 124 (H) 04/19/2019   CHOL 209 (H) 04/13/2019   TRIG 274 (H) 04/13/2019   HDL 30 (L) 04/13/2019   LDLCALC 124 (H) 04/13/2019   ALT 26 04/13/2019   AST 19 04/13/2019   NA 139 04/19/2019   K 3.7 04/19/2019   CL 103 04/19/2019   CREATININE 1.12 (H) 04/19/2019   BUN 12 04/19/2019   CO2 28 04/19/2019   TSH 3.269 03/02/2008   INR 1.5 (H) 04/14/2019   HGBA1C 7.8 (H) 04/13/2019      Assessment / Plan:   #1 status post acute myocardial infarction and subsequent coronary artery bypass grafting-she is making reasonable progress following bypass surgery especially considering her development of pulmonary embolus after discharge.  She notes that she lives in [place] and will continue to be followed by cardiology through [facility]. #2 status post saddle pulmonary embolus-now on Coumadin anticoagulation  She will continue with cardiology follow-up at [facility] He was  encouraged to continue with increasing her physical activity Plan to see her back in the surgery office as needed  Medication Changes: No orders of the defined types were placed in this encounter.     Grace Isaac MD      [address] [place] [address] Office [phone]     07/13/2019 4:12 PM

## 2019-07-13 ENCOUNTER — Ambulatory Visit (INDEPENDENT_AMBULATORY_CARE_PROVIDER_SITE_OTHER): Payer: Self-pay | Admitting: Cardiothoracic Surgery

## 2019-07-13 ENCOUNTER — Other Ambulatory Visit: Payer: Self-pay

## 2019-07-13 VITALS — BP 142/83 | HR 103 | Temp 97.5°F | Resp 24 | Ht 64.5 in | Wt 264.0 lb

## 2019-07-13 DIAGNOSIS — I2510 Atherosclerotic heart disease of native coronary artery without angina pectoris: Secondary | ICD-10-CM

## 2020-06-08 IMAGING — DX DG CHEST 2V
2 series · 2 of 2 positions shown · non-contrast
Comparison: April 18, 2019

CLINICAL DATA: Recent coronary artery bypass grafting

EXAM:
CHEST - 2 VIEW

[dg chest 2 view (1 of 2)]
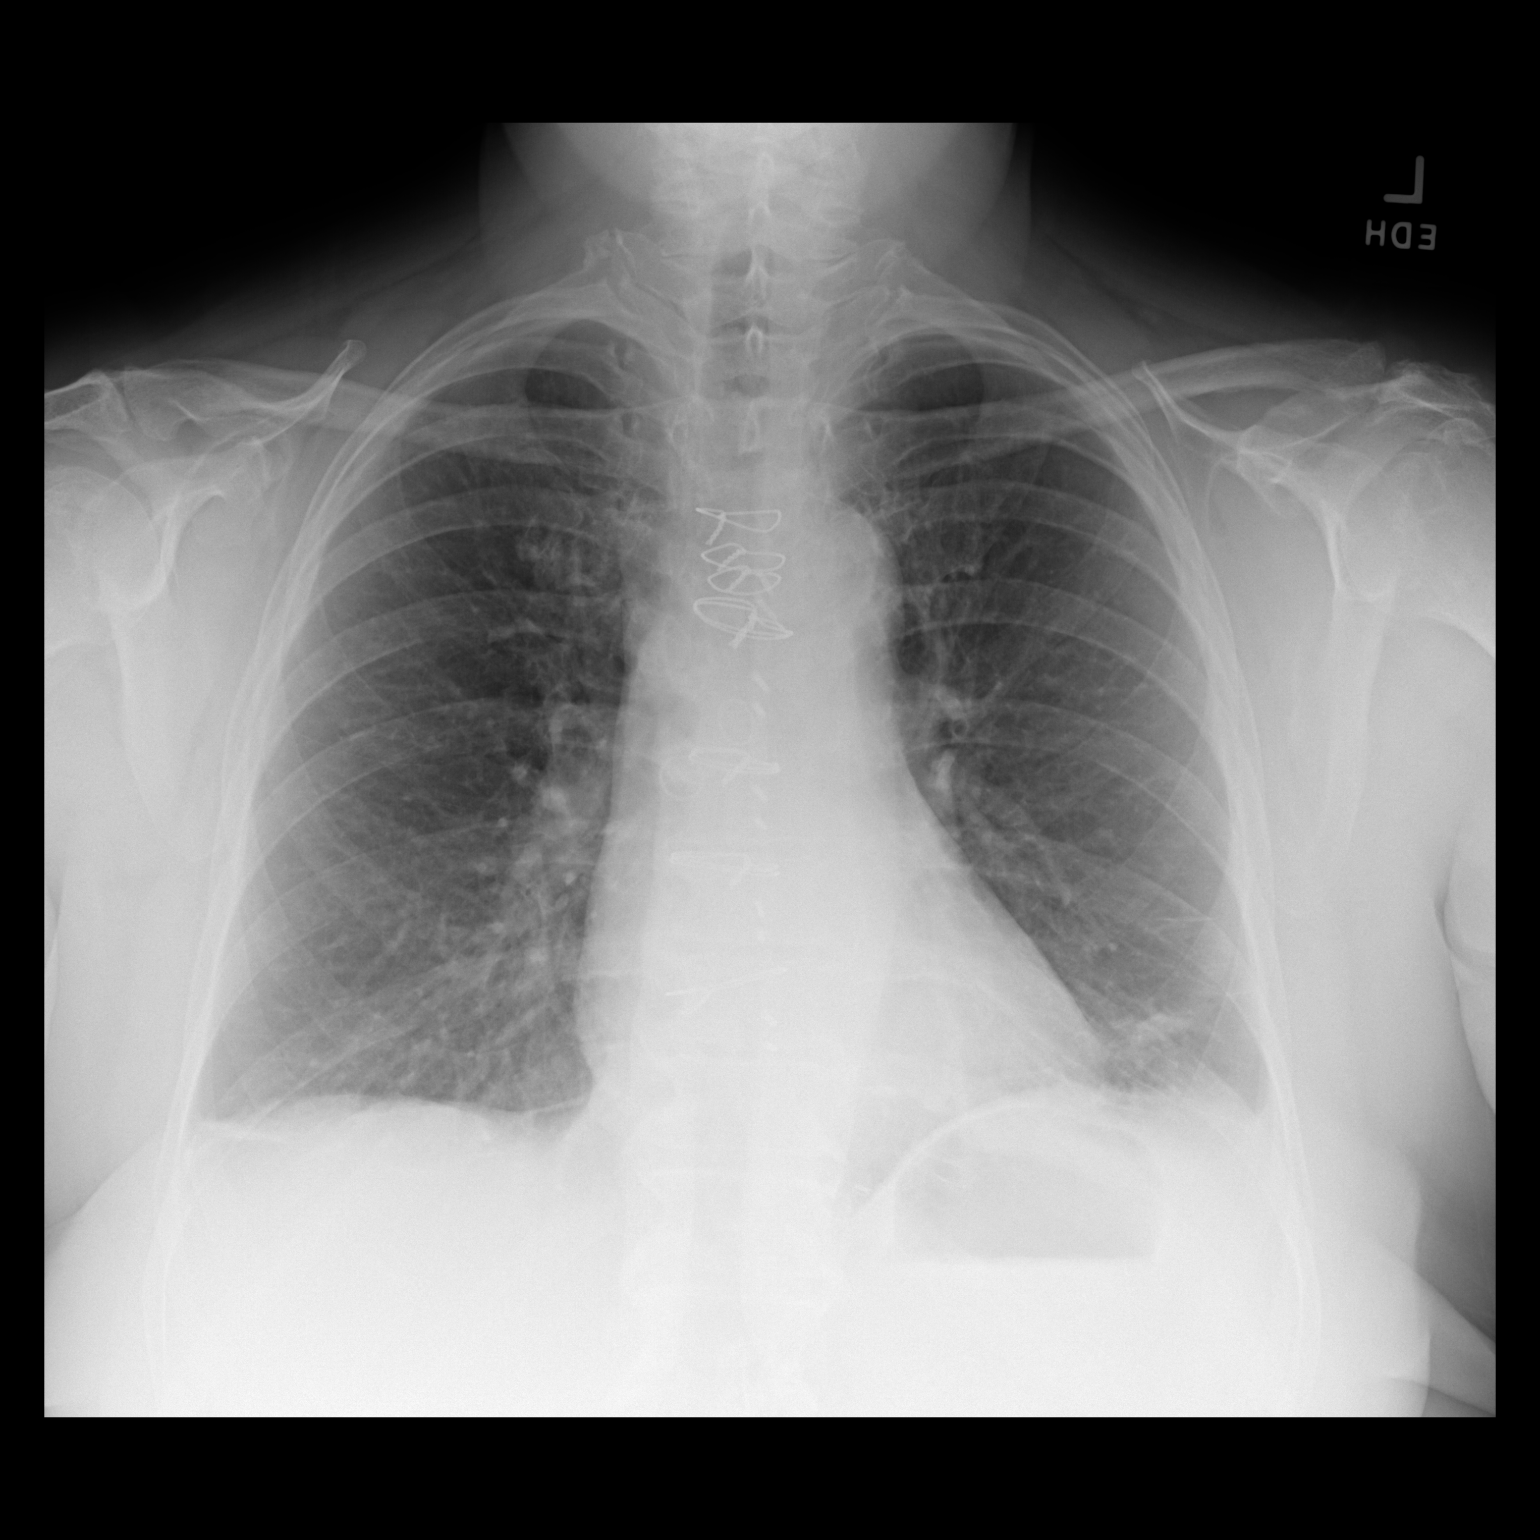

[dg chest 2 view (2 of 2)]
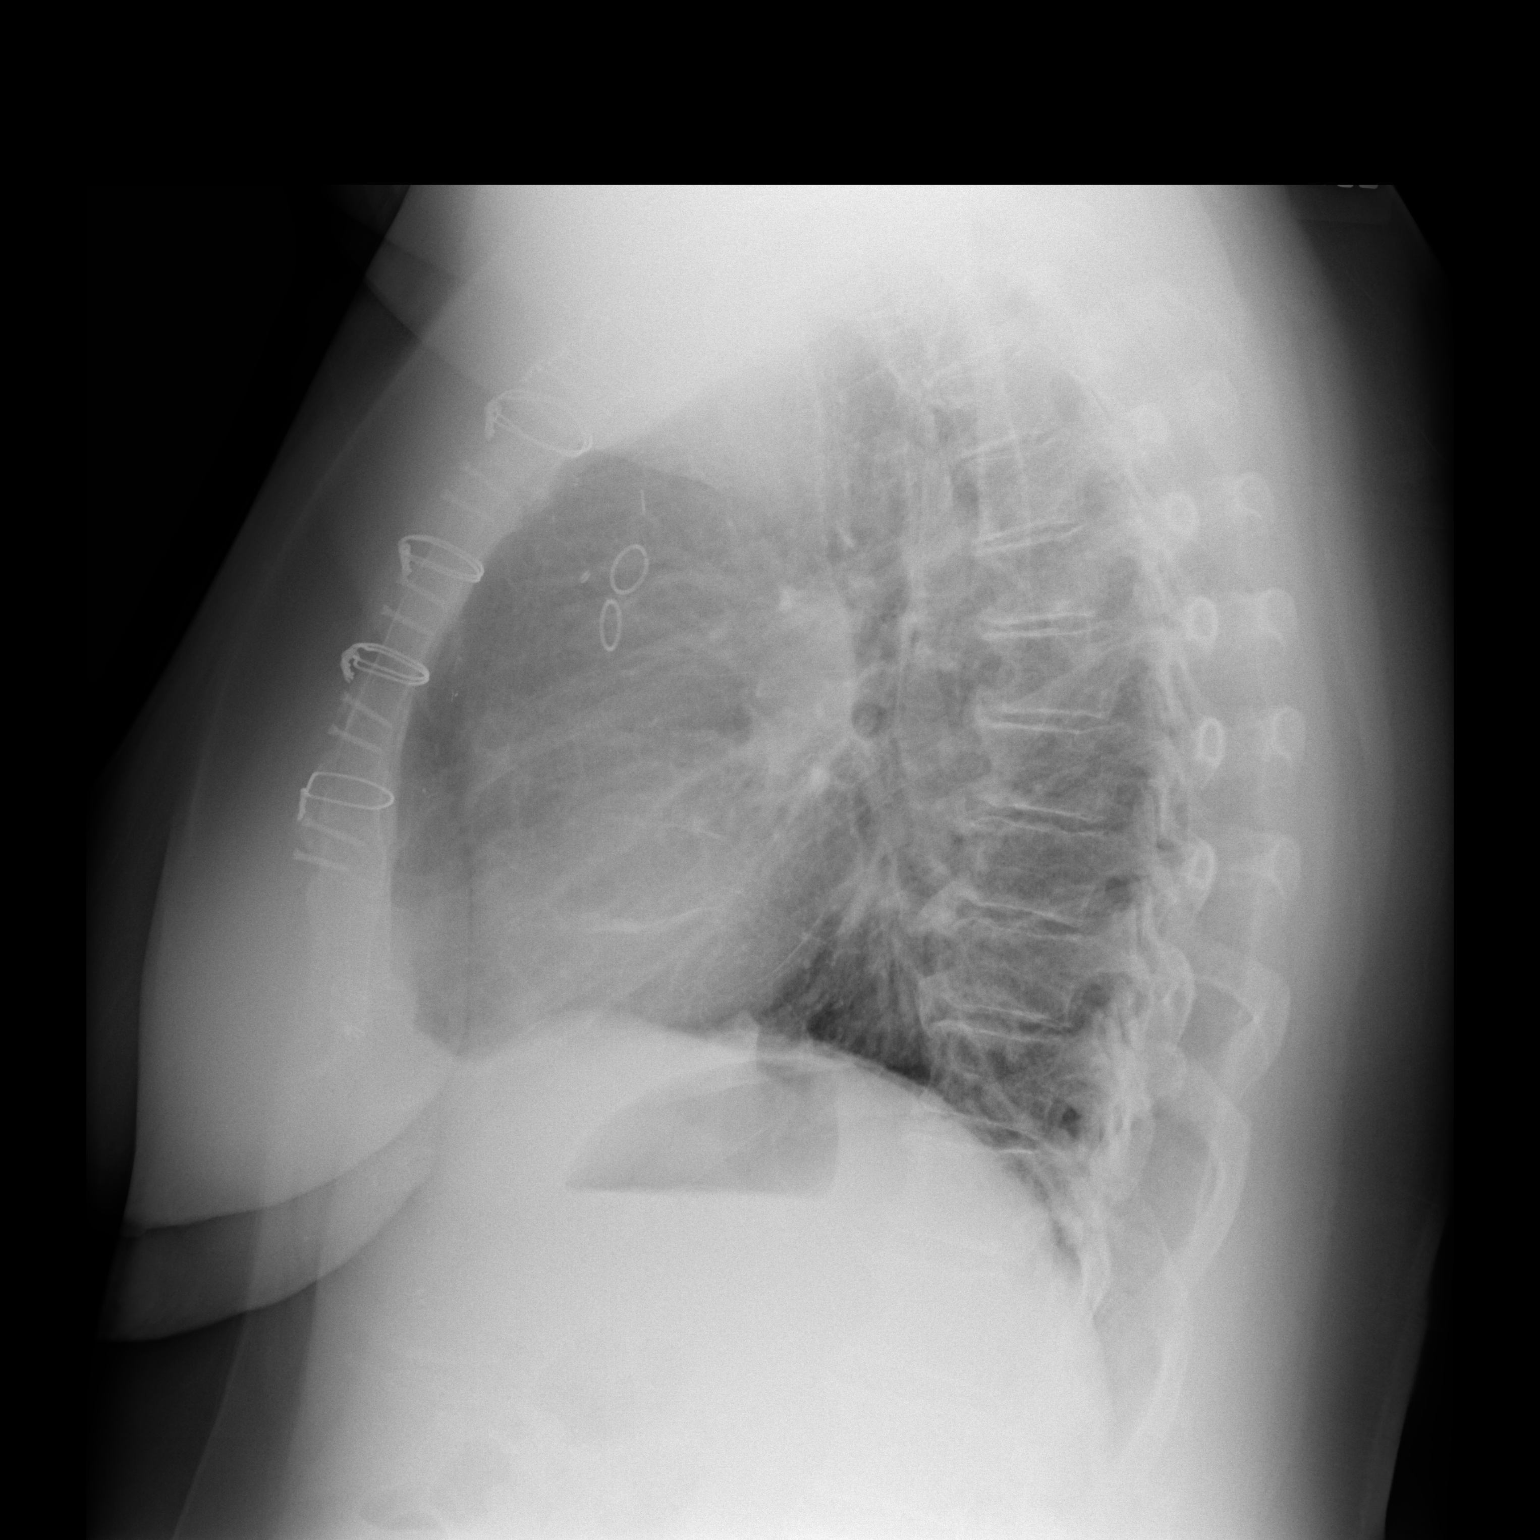

[2 of 2 positions shown; findings below may reference images not displayed]

FINDINGS: There is bibasilar atelectasis with a minimal left pleural effusion.
The lungs elsewhere are clear. Heart size and pulmonary vascularity
are normal. Patient is status post coronary artery bypass grafting.
There is aortic atherosclerosis. No adenopathy. There is
degenerative change in the thoracic spine. No evident pneumothorax.
IMPRESSION: Bibasilar atelectasis with minimal left pleural effusion. Lungs
elsewhere clear. Heart size normal. Status post coronary artery
bypass grafting. Aortic Atherosclerosis ([id number].P).

## 2021-12-23 ENCOUNTER — Encounter: Payer: Self-pay | Admitting: *Deleted

## 2022-01-26 ENCOUNTER — Encounter: Payer: Self-pay | Admitting: Obstetrics and Gynecology

## 2022-01-26 ENCOUNTER — Ambulatory Visit (INDEPENDENT_AMBULATORY_CARE_PROVIDER_SITE_OTHER): Payer: BC Managed Care – PPO | Admitting: Obstetrics and Gynecology

## 2022-01-26 ENCOUNTER — Other Ambulatory Visit (HOSPITAL_COMMUNITY)
Admission: RE | Admit: 2022-01-26 | Discharge: 2022-01-26 | Disposition: A | Discharge status: Home / Self Care | Payer: BC Managed Care – PPO | Source: Ambulatory Visit | Attending: Obstetrics and Gynecology | Admitting: Obstetrics and Gynecology

## 2022-01-26 VITALS — BP 162/92 | HR 87 | Ht 64.0 in | Wt 276.0 lb

## 2022-01-26 DIAGNOSIS — Z30432 Encounter for removal of intrauterine contraceptive device: Secondary | ICD-10-CM | POA: Diagnosis not present

## 2022-01-26 DIAGNOSIS — Z01419 Encounter for gynecological examination (general) (routine) without abnormal findings: Secondary | ICD-10-CM | POA: Insufficient documentation

## 2022-01-26 DIAGNOSIS — Z124 Encounter for screening for malignant neoplasm of cervix: Secondary | ICD-10-CM | POA: Diagnosis not present

## 2022-01-26 NOTE — Progress Notes (Signed)
    GYNECOLOGY OFFICE PROCEDURE NOTE  Michele [name] is a 61 y.o. [id number] here for unknown IUD removal. Placed at least 13 years ago. No GYN concerns.  Last pap smear was many years ago.    IUD Removal  Patient identified, informed consent performed, consent signed.  Patient was in the dorsal lithotomy position, normal external female genitalia was noted.  A speculum was placed in the patient's vagina, normal discharge was noted, no lesions. The cervix was visualized easily with the white strings visible.  The strings of the IUD were grasped and pulled using ring forceps. The IUD was removed in its entirety.   Patient tolerated the procedure well.     Patient was given post-procedure instructions.     Feliz Beam, MD, [place] for [facility])

## 2022-01-26 NOTE — Progress Notes (Signed)
Last pap-too long to remember Mammogram-due in Oct

## 2022-01-26 NOTE — Progress Notes (Signed)
GYNECOLOGY ANNUAL PREVENTATIVE CARE ENCOUNTER NOTE  Subjective:   Michele [name] is a 61 y.o. [phone] female here for a annual gynecologic exam. Current complaints: would like IUD removed. No complaints.  IUD placed at least 13 years ago for bleeding but patient unsure. Had 2-3 periods after IUD but none since. Has not seen a gynecologist in many years, unsure when she had pap last done. Thinks IUD should have been removed at 5 year mark but actually forgot about it until she had pelvic imaging done and it was noted on xray.  Denies abnormal vaginal bleeding, discharge, pelvic pain, problems with intercourse or other gynecologic concerns. Declines STI screen.   Gynecologic History No LMP recorded. Patient is postmenopausal. Contraception: IUD Last Pap: unsure Last mammogram: 02/2021. Results: [place] 1 DEXA: has never had  Obstetric History OB History  Gravida Para Term Preterm AB Living  5 4 4     4   SAB IAB Ectopic Multiple Live Births          4    # Outcome Date GA Lbr Len/2nd Weight Sex Delivery Anes PTL Lv  5 Gravida           4 Term      Vag-Spont   LIV  3 Term      Vag-Spont   LIV  2 Term      Vag-Spont   LIV  1 Term      Vag-Spont   LIV    Past Medical History:  Diagnosis Date   Fibromyalgia    High cholesterol    Hypertension    Pulmonary embolism ([place])    Vaginal Pap smear, abnormal     Past Surgical History:  Procedure Laterality Date   APPLICATION OF WOUND VAC  04/14/2019   Procedure: Application Of Wound Vac;  Surgeon: Grace Isaac, MD;  [facility];  Service: Open Heart Surgery;;  Incisional wound vac.  VAC #[id number]   CORONARY ARTERY BYPASS GRAFT N/A 04/14/2019   Procedure: CORONARY ARTERY BYPASS GRAFTING (CABG)x4. Left intermal mammary take down and endovein harvest right greater saphenous vein. LIMA to LAD. Vein graft to RPD, OM with sequential to distal circ.;  Surgeon: Grace Isaac, MD;  Location: [place];  Service: Open Heart  Surgery;  Laterality: N/A;   ESOPHAGEAL DILATION     HIP SURGERY     LEFT HEART CATH AND CORONARY ANGIOGRAPHY N/A 04/13/2019   Procedure: LEFT HEART CATH AND CORONARY ANGIOGRAPHY;  Surgeon: Troy Sine, MD;  Location: [place] [facility];  Service: Cardiovascular;  Laterality: N/A;   STERNAL CLOSURE Left 04/14/2019   Procedure: Sternal Closure using KLS Sternal Plating Set.;  Surgeon: Grace Isaac, MD;  Location: [place];  Service: Open Heart Surgery;  Laterality: Left;   TEE WITHOUT CARDIOVERSION N/A 04/14/2019   Procedure: TRANSESOPHAGEAL ECHOCARDIOGRAM (TEE);  Surgeon: Grace Isaac, MD;  Location: [place];  Service: Open Heart Surgery;  Laterality: N/A;   TONSILLECTOMY      Current Outpatient Medications on File Prior to Visit  Medication Sig Dispense Refill   aspirin 81 MG EC tablet Take by mouth.     atorvastatin (LIPITOR) 80 MG tablet Take 1 tablet (80 mg total) by mouth daily at 6 PM. 30 tablet 1   cholecalciferol (VITAMIN D3) 25 MCG (1000 UT) tablet Take 4,000 Units by mouth daily.     famotidine (PEPCID) 10 MG tablet Take 10 mg by mouth daily.  levonorgestrel (MIRENA) 20 MCG/DAY IUD 1 each by Intrauterine route once.     lisinopril (ZESTRIL) 20 MG tablet Take 1 tablet (20 mg total) by mouth daily. 30 tablet 1   metFORMIN (GLUCOPHAGE-XR) 500 MG 24 hr tablet Take 500 mg by mouth daily with breakfast.     metoprolol tartrate (LOPRESSOR) 25 MG tablet Take 1 tablet (25 mg total) by mouth 2 (two) times daily. 60 tablet 1   oxybutynin (DITROPAN) 5 MG tablet Take 5 mg by mouth 3 (three) times daily.     pantoprazole (PROTONIX) 40 MG tablet Take 40 mg by mouth 2 (two) times daily.      topiramate (TOPAMAX) 100 MG tablet Take 100 mg by mouth 2 (two) times daily.     vitamin B-12 (CYANOCOBALAMIN) 1000 MCG tablet Take 1,000 mcg by mouth daily.     blood glucose meter kit and supplies KIT Dispense based on patient and insurance preference. Use up to four times daily as directed.  (FOR ICD-9 250.00, 250.01). (Patient not taking: Reported on 01/26/2022) 1 each 0   Colchicine 0.6 MG CAPS Take 1 capsule by mouth 2 (two) times daily. (Patient not taking: Reported on 01/26/2022)     ferrous sulfate 325 (65 FE) MG tablet Take 325 mg by mouth daily with breakfast. (Patient not taking: Reported on 01/26/2022)     isosorbide dinitrate (ISORDIL) 30 MG tablet Take 30 mg by mouth daily. (Patient not taking: Reported on 01/26/2022)     nitroGLYCERIN (NITROSTAT) 0.4 MG SL tablet Place 0.4 mg under the tongue every 5 (five) minutes as needed for chest pain. (Patient not taking: Reported on 01/26/2022)     warfarin (COUMADIN) 1 MG tablet Take 3 mg by mouth one time only at 6 PM.  (Patient not taking: Reported on 01/26/2022)     No current facility-administered medications on file prior to visit.    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  Other Topics Concern   Not on file  Social History Narrative   Not on file   Social Determinants of Health   Financial Resource Strain: Not on file  Food Insecurity: Not on file  Transportation Needs: Not on file  Physical Activity: Not on file  Stress: Not on file  Social Connections: Not on file  Intimate Partner Violence: Not on file    Family History  Problem  Relation Age of Onset   Hypertension Mother    Liver cancer Mother    Heart disease Brother    Hypertension Sister    Cervical cancer Sister    The following portions of the patient's history were reviewed and updated as appropriate: allergies, current medications, past family history, past medical history, past social history, past  surgical history and problem list.  Review of Systems Pertinent items are noted in HPI.   Objective:  BP (!) 162/92   Pulse 87   Ht 5' 4"  (1.626 m)   Wt 276 lb (125.2 kg)   BMI 47.38 kg/m  CONSTITUTIONAL: Well-developed, well-nourished female in no acute distress.  HENT:  Normocephalic, atraumatic, External right and left ear normal. Oropharynx is clear and moist EYES: Conjunctivae and EOM are normal. Pupils are equal, round, and reactive to light. No scleral icterus.  NECK: Normal range of motion, supple, no masses.  Normal thyroid.  SKIN: Skin is warm and dry. No rash noted. Not diaphoretic. No erythema. No pallor. NEUROLOGIC: Alert and oriented to person, place, and time. Normal reflexes, muscle tone coordination. No cranial nerve deficit noted. PSYCHIATRIC: Normal mood and affect. Normal behavior. Normal judgment and thought content. CARDIOVASCULAR: Normal heart rate noted, large sternal midline scar, well healed RESPIRATORY: Effort normal, no problems with respiration noted. BREASTS: Symmetric in size. No masses, skin changes, nipple drainage, or lymphadenopathy. ABDOMEN: Soft, no distention noted.  No tenderness, rebound or guarding.  PELVIC: Normal appearing external genitalia; normal appearing vaginal mucosa and cervix.  No abnormal discharge noted.  Pap smear obtained. IUD strings visible, see note for removal. Normal uterine size, no other palpable masses, no uterine or adnexal tenderness. MUSCULOSKELETAL: Normal range of motion. No tenderness.  No cyanosis, clubbing, or edema.   Exam done with chaperone present.    Assessment and Plan:    1. Well woman exam No issues - Cytology - PAP( [place])  2. Encounter for IUD removal - Discussed that she may have bleeding after removal but would recommend removal if possible, unclear if she would be menopausal but only way to know would be to remove IUD, she is agreeable - IUD removed intact  3. Cervical cancer screening Pap today   Will follow up results of pap smear and manage accordingly. Encouraged improvement in diet and exercise.  COVID vaccine  Declines STI screen. Mammogram UTD Referral for colonoscopy n/a Flu vaccine  DEXA not due based on age  Routine preventative health maintenance measures emphasized. Please refer to After Visit Summary for other counseling recommendations.    Feliz Beam, MD, [place] for [facility])

## 2022-01-27 LAB — CYTOLOGY - PAP
Adequacy: ABSENT
Comment: NEGATIVE
Diagnosis: NEGATIVE
High risk HPV: NEGATIVE
